# Patient Record
Sex: Male | Born: 1940 | Race: White | Hispanic: No | Marital: Married | Smoking: Never smoker
Health system: Southern US, Community
[De-identification: ages and names within clinical notes are randomized; demographics above are authoritative.]

## PROBLEM LIST (undated history)

## (undated) DIAGNOSIS — F32A Depression, unspecified: Secondary | ICD-10-CM

## (undated) DIAGNOSIS — R7301 Impaired fasting glucose: Secondary | ICD-10-CM

## (undated) DIAGNOSIS — M1990 Unspecified osteoarthritis, unspecified site: Secondary | ICD-10-CM

## (undated) DIAGNOSIS — F329 Major depressive disorder, single episode, unspecified: Secondary | ICD-10-CM

## (undated) DIAGNOSIS — D126 Benign neoplasm of colon, unspecified: Secondary | ICD-10-CM

## (undated) DIAGNOSIS — R351 Nocturia: Secondary | ICD-10-CM

## (undated) DIAGNOSIS — E785 Hyperlipidemia, unspecified: Secondary | ICD-10-CM

## (undated) DIAGNOSIS — I10 Essential (primary) hypertension: Secondary | ICD-10-CM

## (undated) DIAGNOSIS — N529 Male erectile dysfunction, unspecified: Secondary | ICD-10-CM

## (undated) DIAGNOSIS — T8131XA Disruption of external operation (surgical) wound, not elsewhere classified, initial encounter: Secondary | ICD-10-CM

## (undated) DIAGNOSIS — G4733 Obstructive sleep apnea (adult) (pediatric): Secondary | ICD-10-CM

## (undated) DIAGNOSIS — I2510 Atherosclerotic heart disease of native coronary artery without angina pectoris: Secondary | ICD-10-CM

## (undated) HISTORY — DX: Male erectile dysfunction, unspecified: N52.9

## (undated) HISTORY — PX: TONSILLECTOMY: SUR1361

## (undated) HISTORY — DX: Major depressive disorder, single episode, unspecified: F32.9

## (undated) HISTORY — DX: Hyperlipidemia, unspecified: E78.5

## (undated) HISTORY — DX: Obstructive sleep apnea (adult) (pediatric): G47.33

## (undated) HISTORY — DX: Nocturia: R35.1

## (undated) HISTORY — PX: OTHER SURGICAL HISTORY: SHX169

## (undated) HISTORY — DX: Depression, unspecified: F32.A

## (undated) HISTORY — DX: Atherosclerotic heart disease of native coronary artery without angina pectoris: I25.10

## (undated) HISTORY — DX: Impaired fasting glucose: R73.01

## (undated) HISTORY — DX: Unspecified osteoarthritis, unspecified site: M19.90

## (undated) HISTORY — DX: Benign neoplasm of colon, unspecified: D12.6

## (undated) HISTORY — DX: Essential (primary) hypertension: I10

## (undated) HISTORY — DX: Disruption of external operation (surgical) wound, not elsewhere classified, initial encounter: T81.31XA

---

## 2005-09-26 HISTORY — PX: OTHER SURGICAL HISTORY: SHX169

## 2007-01-10 HISTORY — PX: OTHER SURGICAL HISTORY: SHX169

## 2010-02-20 DIAGNOSIS — I2510 Atherosclerotic heart disease of native coronary artery without angina pectoris: Secondary | ICD-10-CM

## 2010-02-21 DIAGNOSIS — I2510 Atherosclerotic heart disease of native coronary artery without angina pectoris: Secondary | ICD-10-CM

## 2010-02-21 HISTORY — PX: CORONARY ARTERY BYPASS GRAFT: SHX141

## 2010-03-03 DIAGNOSIS — L03319 Cellulitis of trunk, unspecified: Secondary | ICD-10-CM

## 2010-03-03 DIAGNOSIS — L02219 Cutaneous abscess of trunk, unspecified: Secondary | ICD-10-CM

## 2010-03-05 DIAGNOSIS — IMO0001 Reserved for inherently not codable concepts without codable children: Secondary | ICD-10-CM

## 2010-03-05 DIAGNOSIS — E1165 Type 2 diabetes mellitus with hyperglycemia: Secondary | ICD-10-CM

## 2010-03-16 ENCOUNTER — Encounter (INDEPENDENT_AMBULATORY_CARE_PROVIDER_SITE_OTHER): Payer: Medicare Other

## 2010-03-16 DIAGNOSIS — L02219 Cutaneous abscess of trunk, unspecified: Secondary | ICD-10-CM

## 2010-03-16 DIAGNOSIS — L03319 Cellulitis of trunk, unspecified: Secondary | ICD-10-CM

## 2010-03-17 NOTE — Assessment & Plan Note (Signed)
[facility]  Jonathan Chapman, Jonathan Chapman DOB:  1940/02/22                                        March 16, 2010 CHART #:  [id number]  Jonathan Chapman returned today for scheduled followup of his sternal wound.  He had sternal wound debridement about 10 days ago and has been managed at home with a wound VAC being changed by the home health nurses on Mondays, Wednesdays, and Fridays.  Overall, he feels well.  He denies any fever or chills.  His appetite is reasonably good.  His weight is stable.  The heart is in regular rate and rhythm.  Breath sounds are clear.  The wound VAC apparatus was removed from suction, then the dressing was removed.  The wound was debrided with some devitalized tissue by Dr. Donata Clay.  A new wound VAC sponge was then fashioned in position and placed back on continuous vacuum with a portable device. The tissues appeared very healthy.  There is a good granulation base on all surfaces.  There is minimal drainage in the canister and small amount of drainage that was observed while sponge was out of the wound. It is clear without any purulent character at all.  PLAN:  New prescription for Keflex 500 mg p.o. t.i.d. was given today along with an updated prescription for the tramadol.  He is to follow up in the office in 1 week and as needed.  Home health nurses will continue therapy as previously prescribed.  Jonathan Chapman, M.D. Electronically Signed  MGR/MEDQ  D:  03/16/2010  T:  03/17/2010  Job:  [id number]

## 2010-03-23 ENCOUNTER — Encounter (INDEPENDENT_AMBULATORY_CARE_PROVIDER_SITE_OTHER): Payer: Medicare Other

## 2010-03-23 DIAGNOSIS — L02219 Cutaneous abscess of trunk, unspecified: Secondary | ICD-10-CM

## 2010-03-24 NOTE — Assessment & Plan Note (Unsigned)
[facility]  Jonathan Chapman, Jonathan Chapman DOB:  1940/11/17                                        March 23, 2010 CHART #:  [id number]  The patient returned today for scheduled followup for management of his sternal wound.  He is status post coronary artery bypass grafting by Dr. 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  After his discharge home, he had several severe coughing episodes one of which resulted in a fall and dehiscence of the lower end of the sternum.  He was taken to the operating room by Dr. Donata Clay on March 03, 2010 for wound debridement and removal of the lower two sternal wires.  He has had a wound VAC in place since that time. Overall, the patient reports he feels well.  He denies any fevers.  His appetite and energy level are improving.  The wound VAC apparatus was removed from the suction device and then the dressing was carefully removed.  The wound was inspected and appears to be granulating very nicely.  The bottom end of the sternum remains separated with obvious motion of the sternum edges when the patient is breathing.  Two-thirds of the sternum appeared to be stable.  The wound continues to granulate very nicely.  There is minimal serous drainage at the bottom end of the wound and is otherwise dry.  There were no areas that required debriding at this visit.  A new wound VAC sponge was fashioned to fit the current dimensions of the wound and was put in place.  The suction apparatus was reapplied and reassured that there were no air leaks.  PLAN:  We will continue with the wound VAC therapy with the changes 3 times weekly as we are currently doing.  I have renewed his prescription for Keflex 500 mg p.o. t.i.d. and his Lasix 20 mg p.o. daily.  He will follow up in our office in 1 week and as needed.  Leary Roca, P.A.  MGR/MEDQ  D:  03/23/2010  T:  03/24/2010  Job:  [id number]

## 2010-03-30 ENCOUNTER — Encounter (INDEPENDENT_AMBULATORY_CARE_PROVIDER_SITE_OTHER): Payer: Medicare Other

## 2010-03-30 DIAGNOSIS — L03319 Cellulitis of trunk, unspecified: Secondary | ICD-10-CM

## 2010-03-30 DIAGNOSIS — I2510 Atherosclerotic heart disease of native coronary artery without angina pectoris: Secondary | ICD-10-CM

## 2010-03-30 DIAGNOSIS — L02219 Cutaneous abscess of trunk, unspecified: Secondary | ICD-10-CM

## 2010-03-31 NOTE — Assessment & Plan Note (Unsigned)
[facility]  [name], Jonathan Chapman DOB:  [birth year]                                        March 30, 2010 CHART #:  [id number]  The patient returns today for followup of his sternal wound.  Please see previous notes for the past medical history of this wound.  Wound VAC is being utilized now and is being changed by the home health nursing agency 3 times weekly.  Overall the patient feels well.  He has no complaints.  He has good appetite.  He denies any fever.  He is having minimal drainage from the sternal wound.  The suction was removed from wound VAC apparatus and it was removed in its entirety.  The wound continues to contract in all dimensions.  There is an excellent flare of granulation tissue on all services.  The lower third of the sternum remains unstable.  The lower third is stable and the incision is well- healed.  The new wound VAC sponge was cut to appropriate size and packed into morning.  Wound VAC draped with apparatus was applied and VAC was reapplied with -125 mmHg.  He is to continue with the current wound care as is being managed by advanced services.  He is to follow up with Jonathan Chapman in 2 weeks and as needed.  I asked him to reduce the Keflex to 500 mg p.o. b.i.d.  Leary Roca, P.A.  MGR/MEDQ  D:  03/30/2010  T:  03/31/2010  Job:  [id number]

## 2010-04-13 ENCOUNTER — Encounter (INDEPENDENT_AMBULATORY_CARE_PROVIDER_SITE_OTHER): Payer: Medicare Other

## 2010-04-13 DIAGNOSIS — I2510 Atherosclerotic heart disease of native coronary artery without angina pectoris: Secondary | ICD-10-CM

## 2010-04-14 NOTE — Assessment & Plan Note (Unsigned)
[facility]  BUNNY, LOWDERMILK DOB:  [birth year]                                        April 13, 2010 CHART #:  [id number]  The patient returns today for followup of his sternal wound.  The wound VAC continues to be utilized and the home health nursing agency has seen the patient three times a week.  He is doing well.  Upon removing the wound VAC, the wound continues to heal with good granulation tissue. There was minimal drainage present.  The remaining lower third of the wound remains unstable.  The wound does appear to be healing quite well and he will continue with the wound VAC therapy with three time a week dressing changes.  He will continue also his Keflex 500 mg p.o. b.i.d. and he was given a prescription for Ultram 50 mg 1-2 p.o. q. 4 h. p.r.n. pain.  Otherwise, the patient will return to see [place] in 2 weeks.  Tera Mater. Arvilla Market, MD  BC/MEDQ  D:  04/13/2010  T:  04/14/2010  Job:  [id number]

## 2010-04-27 ENCOUNTER — Encounter (INDEPENDENT_AMBULATORY_CARE_PROVIDER_SITE_OTHER): Payer: Medicare Other

## 2010-04-27 DIAGNOSIS — I2510 Atherosclerotic heart disease of native coronary artery without angina pectoris: Secondary | ICD-10-CM

## 2010-04-27 DIAGNOSIS — T8140XA Infection following a procedure, unspecified, initial encounter: Secondary | ICD-10-CM

## 2010-04-28 NOTE — Assessment & Plan Note (Unsigned)
[facility]  Jonathan Chapman, Jonathan Chapman DOB:  11-Jun-1940                                        April 27, 2010 CHART #:  [id number]  We saw the patient in the clinic today.  He continues to wear the wound VAC over the sternal wound and wound continues to granulate nicely. There continues to be some movement of the lower third of the sternal wound.  We discussed with the patient discontinuing the wound VAC, but we will continue to use it for one more week with the anticipation of discontinuing the wound VAC next week and beginning b.i.d. dressing changes.  He will continue to take his Keflex 500 mg p.o. b.i.d. and we will see him in the clinic next week.  Tera Mater. Arvilla Market, MD  BC/MEDQ  D:  04/27/2010  T:  04/28/2010  Job:  [id number]

## 2010-05-05 ENCOUNTER — Encounter (INDEPENDENT_AMBULATORY_CARE_PROVIDER_SITE_OTHER): Payer: Medicare Other

## 2010-05-05 DIAGNOSIS — I2510 Atherosclerotic heart disease of native coronary artery without angina pectoris: Secondary | ICD-10-CM

## 2010-05-11 ENCOUNTER — Encounter (INDEPENDENT_AMBULATORY_CARE_PROVIDER_SITE_OTHER): Payer: Medicare Other

## 2010-05-11 DIAGNOSIS — T8140XA Infection following a procedure, unspecified, initial encounter: Secondary | ICD-10-CM

## 2010-05-12 NOTE — Assessment & Plan Note (Signed)
[facility]  Jonathan, Chapman DOB:  [birth year]                                        May 11, 2010 CHART #:  [id number]  The patient returned today for followup of his sternal wound.  The wound VAC was discontinued last week and his wife has been caring for it with saline moist-to-dry dressing changes twice daily.  Overall, the patient states that he feels well.  He has no new complaints.  He denies any new changes in his sternal wound including any additional pain.  His appetite is good and he remains fairly active.  On exam, the wound dressing was removed.  The skin to the left and right of the open wound remains reddened with the left being a little more pronounced than the right.  This area had become macerated by the wound VAC film dressing.  There is no breakdown of the skin.  It is not tender.  The wound itself is not changed in dimensions since previous exam.  There is some fibrinous granulation tissue along both wound edges.  The wound was examined and debrided by Dr. Dorris Fetch.  The silver nitrate was applied to the granulation tissue.  The wound was then redressed with a saline moistened 2x2 gauze and covered with a dry dressing.  The possibility of additional debridement in the operating room and possible delayed primary closure was discussed with the patient and his wife and Dr. Dorris Fetch.  He was concerned that the present condition of the wound edges may lead to suboptimal result.  We will continue with the saline moist-to-dry dressings until the skin changes have resolved. I asked the patient to discontinue the Keflex and gave him new prescription for dicloxacillin 250 mg p.o. q.6 h.  He has to follow up in 2 weeks and as needed.  Salvatore Decent Dorris Fetch, M.D. Electronically Signed  MGR/MEDQ  D:  05/11/2010  T:  05/12/2010  Job:  [id number]

## 2010-05-24 ENCOUNTER — Encounter (INDEPENDENT_AMBULATORY_CARE_PROVIDER_SITE_OTHER): Payer: Medicare Other

## 2010-05-24 DIAGNOSIS — I2510 Atherosclerotic heart disease of native coronary artery without angina pectoris: Secondary | ICD-10-CM

## 2010-05-24 DIAGNOSIS — T8140XA Infection following a procedure, unspecified, initial encounter: Secondary | ICD-10-CM

## 2010-05-25 NOTE — Assessment & Plan Note (Addendum)
[facility]  Jonathan Chapman, Jonathan Chapman DOB:  February 21, 1940                                        May 25, 2010 CHART #:  [id number]  The patient returned for follow up of his sternal wound.  Overall, he feels well.  He describes having few sharp pains in the parasternal area which come and go very quickly.  He is not having any fevers.  His appetite is good, and overall he is making reasonable progress following surgery.  The wound packing is removed.  The wound measures about 4 cm in length by 1.8 cm in depth by approximately 0.5 cm in width.  There is some thin yellowish drainage at the wound as well as on the packing material that was removed.  The skin surrounding the wound has continued to improve since his last visit.  He is currently taking dicloxacillin 500 mg p.o. q.i.d.  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  The wound was cultured and when it cleaned and repacked with a saline-moistened gauze and covered with dry dressing.  The wound was examined and was seen by Dr. Laneta Simmers.  Dr. Laneta Simmers discussed the situation at length with the patient and his wife.  We should continue with the saline-moistened dry dressing changes at this point.  He also discussed the possibility of additional surgical debridement followed by possible reconstructive surgery at some point.  PLAN:  The patient will continue with the saline-moist dry dressing changes twice daily and also continue with dicloxacillin pending the culture results.  Evelene Croon, M.D. Electronically Signed  MGR/MEDQ  D:  05/25/2010  T:  05/25/2010  Job:  [id number]

## 2010-06-02 ENCOUNTER — Encounter (INDEPENDENT_AMBULATORY_CARE_PROVIDER_SITE_OTHER): Payer: Medicare Other

## 2010-06-02 DIAGNOSIS — T8140XA Infection following a procedure, unspecified, initial encounter: Secondary | ICD-10-CM

## 2010-06-02 DIAGNOSIS — I2510 Atherosclerotic heart disease of native coronary artery without angina pectoris: Secondary | ICD-10-CM

## 2010-06-03 NOTE — Assessment & Plan Note (Unsigned)
[facility]  Jonathan Chapman, Jonathan Chapman DOB:  06/27/1940                                        Jun 02, 2010 CHART #:  [id number]  The patient returned today for followup of his open sternal wound.  He denies any new symptoms.  He reports he continues to have some sharp pain along the left costal angle when he takes a deep breath.  This is a very short duration and does not radiate.  He denies any fever or change in appetite.  He is continuing with the saline moist-to-dry dressings as we have requested.  The patient requested referral to Dr. Madelin Headings of the Infectious Disease Service last week.  Dr. Trisha Mangle contacted me few days ago with results of that clinical encounter.  On examination, she does not believe there is active infection within his wound.  She further relate to me that she obtained C-reactive protein and sed rate, which were within normal limits.  She obtained cultures in her office, which grew Corynebacterium, which she consider to be skin contaminant.  Conclusion is that there was no infection in this wound to treat at this time based on what she is able to observe clinically and also based on the studies mentioned above.  The culture that we obtained in our office last week had no growth at all.  After further discussion of the patient's case with Dr. Laneta Simmers last week, he is recommended referral to plastic surgeon with whom he has worked on similar cases in [place].  I discussed this with the patient and his wife and they agreed to proceed with discussion of possible closure by plastic surgeon.  We will contact your office and make the necessary arrangements for an appointment next week.  The wound dressing is changed today and the wound is clean with very small amount of yellowish tanned exudate.  There is good granulation on all surfaces.  The wound probes approximately 1.5 cm in depth and is only about 2 mm wide x 4 cm  in length.  He had instability of the lower part of the sternum and is unchanged.  PLAN:  As discussed above we will arrange for referral to a plastic surgeon and arrange for next week regarding further surgical intervention.  The patient and his wife are in agreement with this plan of management.  I see no reason for continuing the antibiotics and I have asked him to stop the doxycycline at this stage.  Leary Roca, P.A.  MGR/MEDQ  D:  06/02/2010  T:  06/03/2010  Job:  [id number]

## 2010-06-15 ENCOUNTER — Encounter (INDEPENDENT_AMBULATORY_CARE_PROVIDER_SITE_OTHER): Payer: Medicare Other

## 2010-06-15 DIAGNOSIS — I2510 Atherosclerotic heart disease of native coronary artery without angina pectoris: Secondary | ICD-10-CM

## 2010-06-15 DIAGNOSIS — T8140XA Infection following a procedure, unspecified, initial encounter: Secondary | ICD-10-CM

## 2010-06-15 NOTE — Assessment & Plan Note (Signed)
[facility]  Jonathan Chapman, Jonathan Chapman DOB:  1940-09-03                                        June 15, 2010 CHART #:  [id number]  The patient returned today for followup of his sternotomy wound.  He has been doing fairly well at home and has been doing wet-to-dry dressing changes to the wound.  He denies any fever or chills.  He has had some pain on both sides of his chest related to activity and he believes that some of it may be due to how he slept at night.  We did a CT scan of his chest on June 13, 2010, to reevaluate the sternum and see how things were healing deeper down.  This showed that the upper portion of the sternum and manubrium are together with healing and new bone formation.  The lower third of the sternum has multiple fractures on both sides with bone fragments visible.  The sternal wires have been removed at the time of debridement.  There is visible open wound in the midline that extends downward beneath the sternum.  There is associated inflammatory changes in the deeper tissues down to the pericardium, but there were no fluid collections and no sign of an abscess.  On physical examination today, his chest wound is about half a size that it was when I last saw him a couple of weeks ago.  The skin looks much better with no signs of infection.  The wound itself, is clean and granulating.  It appears to be closing nicely.  There is no tenderness.  IMPRESSION:  The patient has a healing sternal wound status post removal of the lowermost 3 sternal wires and treatment with a wound VAC following a fall with fracture of the sternum at multiple levels.  I think his wound is healing in fairly well and there are no signs of infection.  He was seen by Dr. Trisha Mangle, of Infectious Disease and was not felt to have any active infection.  Apparently, his sed rate and CRP were within normal limits.  The upper two-third of his sternum appears to be  healing together well.  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  I explained to him that the lower sternum will never be completely stable, but I think he still would be able to be quite active as long as it is healing.  I discussed the possibility that this may heal and then breakdown again indicating that there is a focus of foreign body or nonviable bone deep in the wound and this would require open debridement and probably muscle flap reconstruction.  I told him I did not see any reason to proceed ahead in that direction unless it was clear that the wound had broken down again after healing.  I did discuss the case with Dr. Etter Sjogren of Plastic and Reconstructive Surgery in [place] and he has graciously agreed to see the patient for consultation in case debridement and reconstruction is necessary down the road.  His wife was given the number for Dr. Odis Luster' office and will call to make an appointment.  All of their questions were answered and I will have him return to the office in 2 weeks so that his primary surgeon, Dr. Sheliah Plane can examine the wound.  I think that when Dr. Tyrone Sage returns to [facility], the patient should be followed up in our [facility] by either Dr. Tyrone Sage or myself until this problem is completely resolved.  Evelene Croon, M.D. Electronically Signed  BB/MEDQ  D:  06/15/2010  T:  06/15/2010  Job:  [id number]  cc:   Etter Sjogren, M.D.

## 2010-06-29 ENCOUNTER — Encounter (INDEPENDENT_AMBULATORY_CARE_PROVIDER_SITE_OTHER): Payer: Medicare Other

## 2010-06-29 DIAGNOSIS — I2510 Atherosclerotic heart disease of native coronary artery without angina pectoris: Secondary | ICD-10-CM

## 2010-06-30 NOTE — Assessment & Plan Note (Signed)
[facility]  Jonathan Chapman, Jonathan Chapman DOB:  [birth year]                                        June 29, 2010 CHART #:  [id number]  The patient returns to the office today for followup visit regarding his sternal wound.  On June 13, 2010, he had undergone CT scan of the chest. He appears to be doing well at home.  He denies any popping and clicking of the sternum though we know the lower portion has some instability. After being discharged home, he fell and struck his sternum and since then had required debridement of the sternal incision.  At this point, he is doing well at home with local wound care.  On exam the patient's sternum to palpation feels stable even the very lower portion does not have a lot of movement.  The area that had been open is now almost completely granulated in with complete skin closure except for a very small part at the lower pole of the incision.  There is no evidence of active infection or cellulitis.  After seeing the patient and examining him I have discussed with him not proceeding with any further operative therapy at this point it is likely that it will granulate in.  With the upper two-thirds of the sternum stable and bone formation, it is likely he will be able to get back to normal activities.  I have still cautioned him about any heavy lifting and will follow up in 4-6 weeks to see how he is doing and how the wound is healing.  At this point, I see no advantage to any muscle flaps or rewiring of his sternum and this has been discussed with him.  Sheliah Plane, MD Electronically Signed  EG/MEDQ  D:  06/29/2010  T:  06/30/2010  Job:  [id number]

## 2010-08-04 ENCOUNTER — Encounter: Payer: Medicare Other | Admitting: Cardiothoracic Surgery

## 2010-08-04 ENCOUNTER — Encounter (INDEPENDENT_AMBULATORY_CARE_PROVIDER_SITE_OTHER): Payer: Medicare Other | Admitting: Cardiothoracic Surgery

## 2010-08-04 DIAGNOSIS — I2510 Atherosclerotic heart disease of native coronary artery without angina pectoris: Secondary | ICD-10-CM

## 2010-08-04 NOTE — Assessment & Plan Note (Signed)
OFFICE VISIT  WAKE, CONLEE DOB:  1940-02-17                                        August 04, 2010 CHART #:  [id number]  The patient underwent coronary artery bypass grafting in [facility] in mid February.  He developed a partial dehiscence of the lower portion of his sternal incision which was re-explored by Dr. Morton Peters.  He has had some problem with lower portion of the wound healing, but now returns for followup visit.  Since last seen a month ago, he has done well.  The incision is now completely healed.  He has no drainage. There is no popping, clicking, or obvious instability of incision.  On exam, his blood pressure 106/68, pulse is 75, respiratory rate is 18, and O2 sats 97%.  His sternum is stable and well healed.  There is no drainage area.  The lower portion of this incision is retracted slightly.  His lungs are clear bilaterally.  Lower extremities are without any edema.  The patient seems to be doing well now with his wound stabilized without any evidence of dehiscence or looseness of the sternum, will allow him to slowly increase his activities, have line into the tennis ball slightly, but no competitive plan yet.  He is enrolled in the cardiac rehab program in [place].  I will plan to see him back in 3 months with a followup chest x-ray.  Sheliah Plane, MD Electronically Signed  EG/MEDQ  D:  08/04/2010  T:  08/04/2010  Job:  [id number]  cc:   [facility]

## 2010-08-05 NOTE — Assessment & Plan Note (Signed)
[facility]  Jonathan Chapman, Jonathan Chapman DOB:  05-23-1940                                        May 25, 2010 CHART #:  [id number]  The patient called the office earlier today and asked for referral to the Infectious Disease Service, specifically Dr. Trisha Mangle, with whom the patient has worked in the past.  I contacted Dr. Uvaldo Bristle office to confirm that we approve of her proceeding with consultation regarding his case.  The patient has a followup appointment with [place] in 1 week.  Evelene Croon, M.D. Electronically Signed  MGR/MEDQ  D:  05/25/2010  T:  05/25/2010  Job:  [id number]

## 2010-10-20 ENCOUNTER — Ambulatory Visit: Payer: Medicare Other | Admitting: Cardiothoracic Surgery

## 2010-10-25 ENCOUNTER — Encounter: Payer: Self-pay | Admitting: Cardiothoracic Surgery

## 2010-10-25 DIAGNOSIS — R7301 Impaired fasting glucose: Secondary | ICD-10-CM | POA: Insufficient documentation

## 2010-10-25 DIAGNOSIS — I10 Essential (primary) hypertension: Secondary | ICD-10-CM

## 2010-10-25 DIAGNOSIS — R351 Nocturia: Secondary | ICD-10-CM | POA: Insufficient documentation

## 2010-10-25 DIAGNOSIS — D126 Benign neoplasm of colon, unspecified: Secondary | ICD-10-CM | POA: Insufficient documentation

## 2010-10-25 DIAGNOSIS — N529 Male erectile dysfunction, unspecified: Secondary | ICD-10-CM

## 2010-10-25 DIAGNOSIS — G4733 Obstructive sleep apnea (adult) (pediatric): Secondary | ICD-10-CM | POA: Insufficient documentation

## 2010-10-25 DIAGNOSIS — F329 Major depressive disorder, single episode, unspecified: Secondary | ICD-10-CM

## 2010-10-25 DIAGNOSIS — E785 Hyperlipidemia, unspecified: Secondary | ICD-10-CM | POA: Insufficient documentation

## 2010-10-25 DIAGNOSIS — I2510 Atherosclerotic heart disease of native coronary artery without angina pectoris: Secondary | ICD-10-CM | POA: Insufficient documentation

## 2010-10-25 DIAGNOSIS — F32A Depression, unspecified: Secondary | ICD-10-CM | POA: Insufficient documentation

## 2010-10-26 ENCOUNTER — Other Ambulatory Visit: Payer: Self-pay | Admitting: Cardiothoracic Surgery

## 2010-10-26 DIAGNOSIS — I2510 Atherosclerotic heart disease of native coronary artery without angina pectoris: Secondary | ICD-10-CM

## 2010-10-26 DIAGNOSIS — T8131XA Disruption of external operation (surgical) wound, not elsewhere classified, initial encounter: Secondary | ICD-10-CM | POA: Insufficient documentation

## 2010-10-27 ENCOUNTER — Ambulatory Visit
Admission: RE | Admit: 2010-10-27 | Discharge: 2010-10-27 | Disposition: A | Discharge status: Home / Self Care | Payer: Medicare Other | Source: Ambulatory Visit | Attending: Cardiothoracic Surgery | Admitting: Cardiothoracic Surgery

## 2010-10-27 ENCOUNTER — Ambulatory Visit (INDEPENDENT_AMBULATORY_CARE_PROVIDER_SITE_OTHER): Payer: Medicare Other | Admitting: Cardiothoracic Surgery

## 2010-10-27 ENCOUNTER — Encounter: Payer: Self-pay | Admitting: Cardiothoracic Surgery

## 2010-10-27 VITALS — BP 128/76 | HR 60 | Resp 16 | Ht 70.5 in | Wt 225.0 lb

## 2010-10-27 DIAGNOSIS — I2510 Atherosclerotic heart disease of native coronary artery without angina pectoris: Secondary | ICD-10-CM

## 2010-10-27 NOTE — Progress Notes (Signed)
[address]            [place] [address]          [phone]       Jonathan Chapman Date of Birth: [birth year]  Linford Arnold, MD  Chief Complaint:  Chief Complaint  Patient presents with  . 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 He developed a lower sternal dehiscence following his surgery requiring reexploration and removal of the lower 2 sternal wires. The remainder of the sternum continued to heal and the patient did not require any other for surgical intervention. He returns now noting that he is back playing tennis without difficulty. He notes some slight shortness of breath with heavy exertion, which is improved as he's increased his physical activity.  Past Medical History  Diagnosis Date  . Adenoma of large intestine     benign  . Benign essential HTN   . CAD (coronary artery disease)   . Depression   . Hyperlipemia   . Impaired fasting glucose   . Obstructive sleep apnea     moderate  . Organic impotence   . 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 Nocturia   . Dehiscence of surgical wound     Past Surgical History  Procedure Date  . Left knee replacement 09/26/2005  . Tonsillectomy   . Right total knee arthroplasty 2009  . Uvuloplasty   . Coronary artery bypass graft 02/21/2010    [facility]     History  Smoking status  . Never Smoker   Smokeless tobacco  . Not on file    History  Alcohol Use No    History   Social History  . Marital Status: Married    Spouse Name: N/A    Number of Children: N/A  . Years of Education: N/A   Occupational History  . Not on file.   Social History Main Topics  . Smoking status: Never Smoker   . Smokeless  tobacco: Not on file  . Alcohol Use: No  . Drug Use: No  . Sexually Active: Not on file   Other Topics Concern  . Not on file   Social History Narrative  . No narrative on file    No Known Allergies  Current Outpatient Prescriptions  Medication Sig Dispense Refill  . Ascorbic Acid (VITAMIN C) 100 MG tablet Take 100 mg by mouth daily.        Marland Kitchen aspirin 81 MG tablet Take 81 mg by mouth daily.        Marland Kitchen atorvastatin (LIPITOR) 40 MG tablet Take 20 mg by mouth daily.       . carvedilol (COREG) 6.25 MG tablet Take 6.25 mg by mouth 2 (two) times daily with a meal.        . docusate sodium (COLACE) 100 MG capsule Take 100 mg by mouth 2 (two) times daily.        . ferrous sulfate (SLOW IRON) 160 (50 FE) MG TBCR Take by mouth daily.        . fexofenadine (ALLEGRA) 180 MG tablet Take 180 mg by mouth daily  as needed.       Marland Kitchen HYDROcodone-acetaminophen (VICODIN) 5-500 MG per tablet Take 1 tablet by mouth every 6 (six) hours as needed.        Marland Kitchen lisinopril (PRINIVIL,ZESTRIL) 5 MG tablet Take 5 mg by mouth daily.        . multivitamin (THERAGRAN) per tablet Take 1 tablet by mouth daily.        . sertraline (ZOLOFT) 50 MG tablet Take 25 mg by mouth daily.       . traMADol (ULTRAM) 50 MG tablet Take 50 mg by mouth every 6 (six) hours as needed.        . Vitamin D, Ergocalciferol, (DRISDOL) 50000 UNITS CAPS Take 50,000 Units by mouth.        . beta carotene w/minerals (OCUVITE) tablet Take 1 tablet by mouth daily.        . calcium citrate (CALCITRATE - DOSED IN MG ELEMENTAL CALCIUM) 950 MG tablet Take 1 tablet by mouth daily.           No family history on file.      Physical Exam: BP 128/76  Pulse 60  Resp 16  Ht 5' 10.5" (1.791 m)  Wt 225 lb (102.059 kg)  BMI 31.83 kg/m2  SpO2 96%  On physical exam the patient appears well He has no carotid bruits His lungs are clear bilaterally His sternum is stable without any movement of the bone There is some slight retraction of the incision at  the lower end There is no evidence of sternal wound infection Abdominal exam is benign Lower extremities are without calf tenderness or pedal edema   Diagnostic Studies & Laboratory data: Clinical Data: Status post cardiac surgery in February  CHEST - 2 VIEW  Comparison: None.  Findings: The heart is borderline enlarged. Lungs are under  aerated and clear. Bronchitic changes. No pneumothorax and no  pleural effusion.  IMPRESSION:  Borderline cardiomegaly without decompensation.  Original Report Authenticated By: Donavan Burnet, M.D.     Assessment / Plan: Both from physical exam and chest x-ray appears the patient has now completely healed his sternum and returned to normal physical activity including playing tennis without difficulty He's had no recurrent episodes of angina or congestive heart failure. His original presentation was that of cardiac arrest on the tennis court. I've not made him a return appointment to see me but would be glad to see him at his or DrDaniel's request at any time.   Delight Ovens MD

## 2010-11-03 ENCOUNTER — Ambulatory Visit: Payer: Medicare Other | Admitting: Cardiothoracic Surgery

## 2012-08-28 IMAGING — CR DG CHEST 2V
2 series · 2 of 2 positions shown · non-contrast
Comparison: None.

CLINICAL DATA: Status post cardiac surgery in [REDACTED]

CHEST - 2 VIEW

[w chest pa]
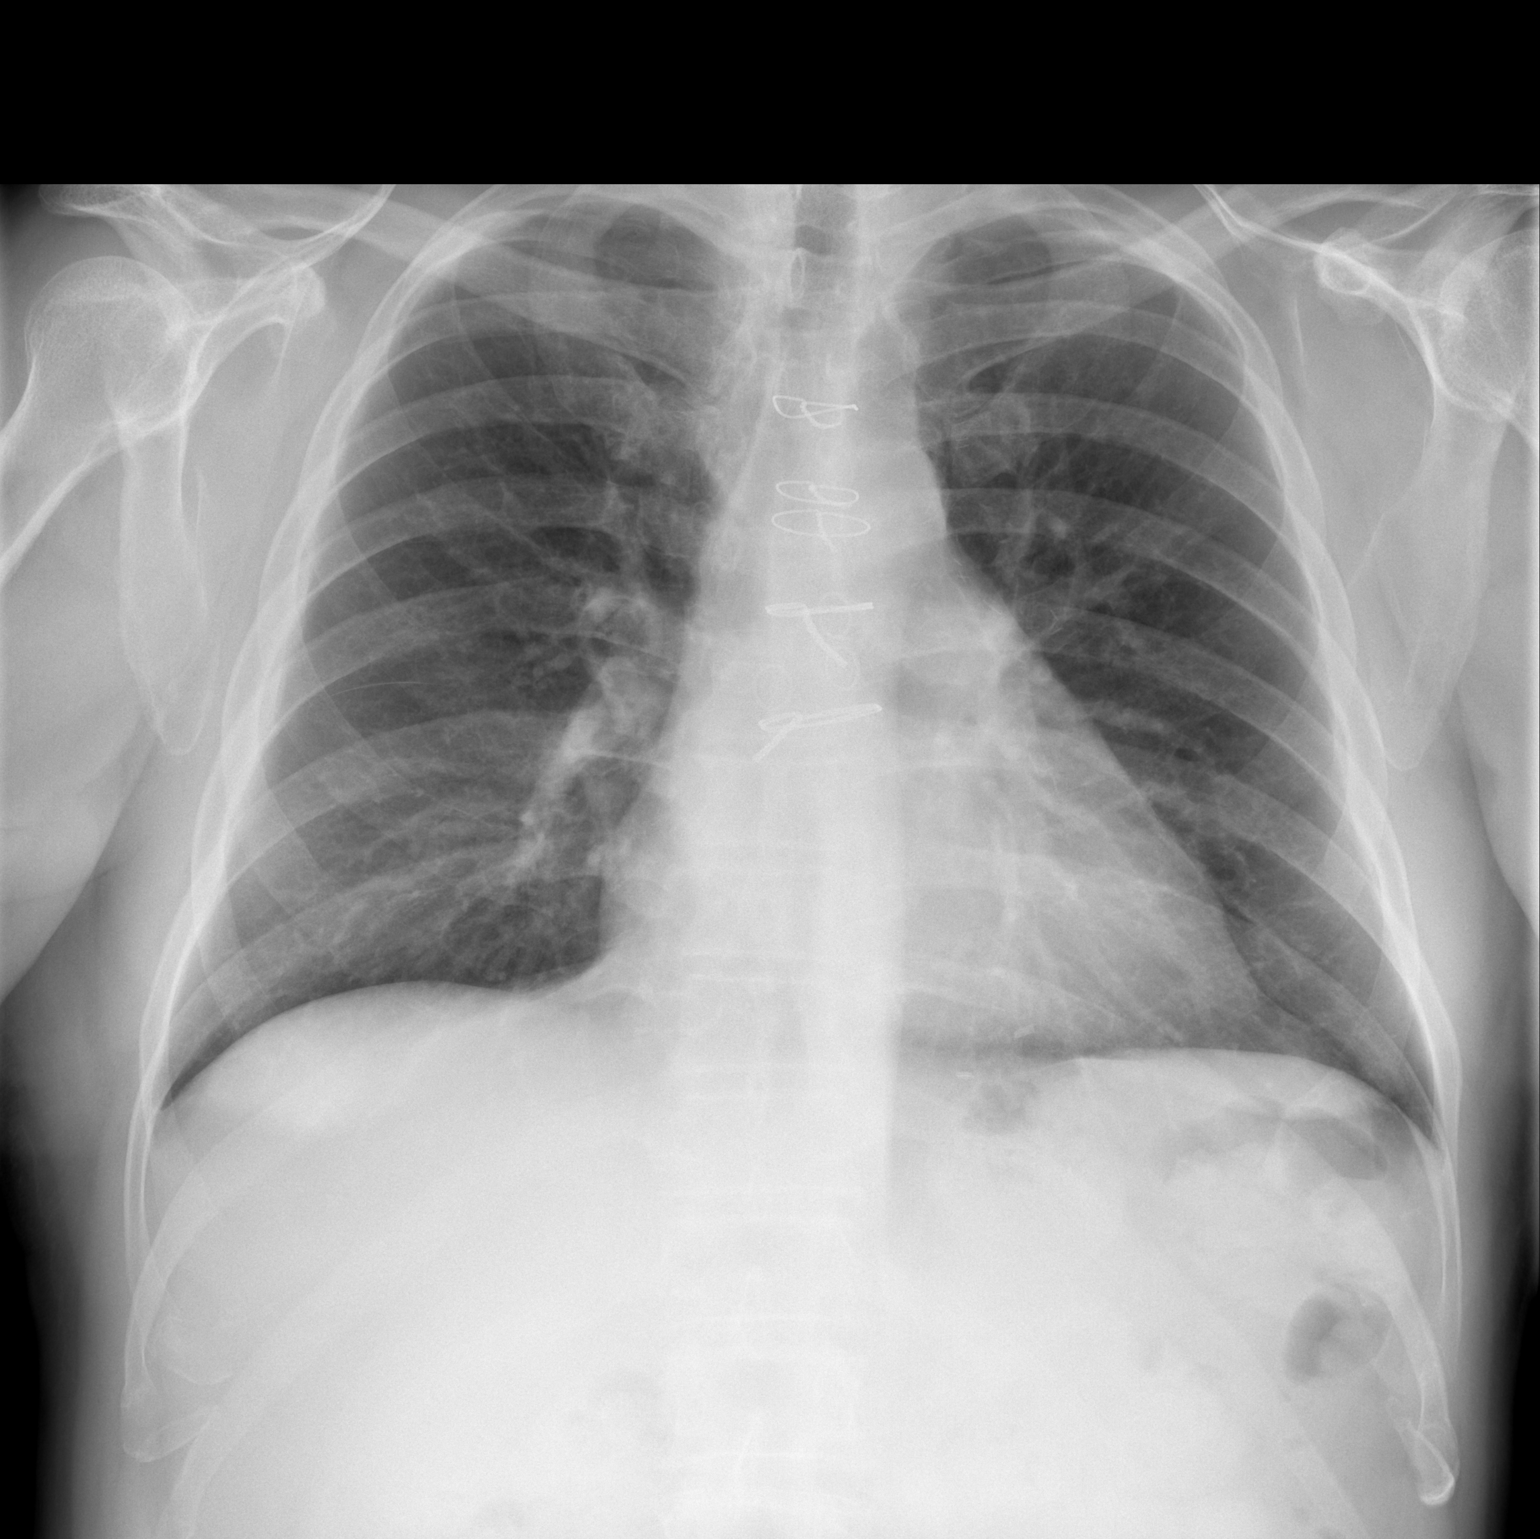

[w chest lat]
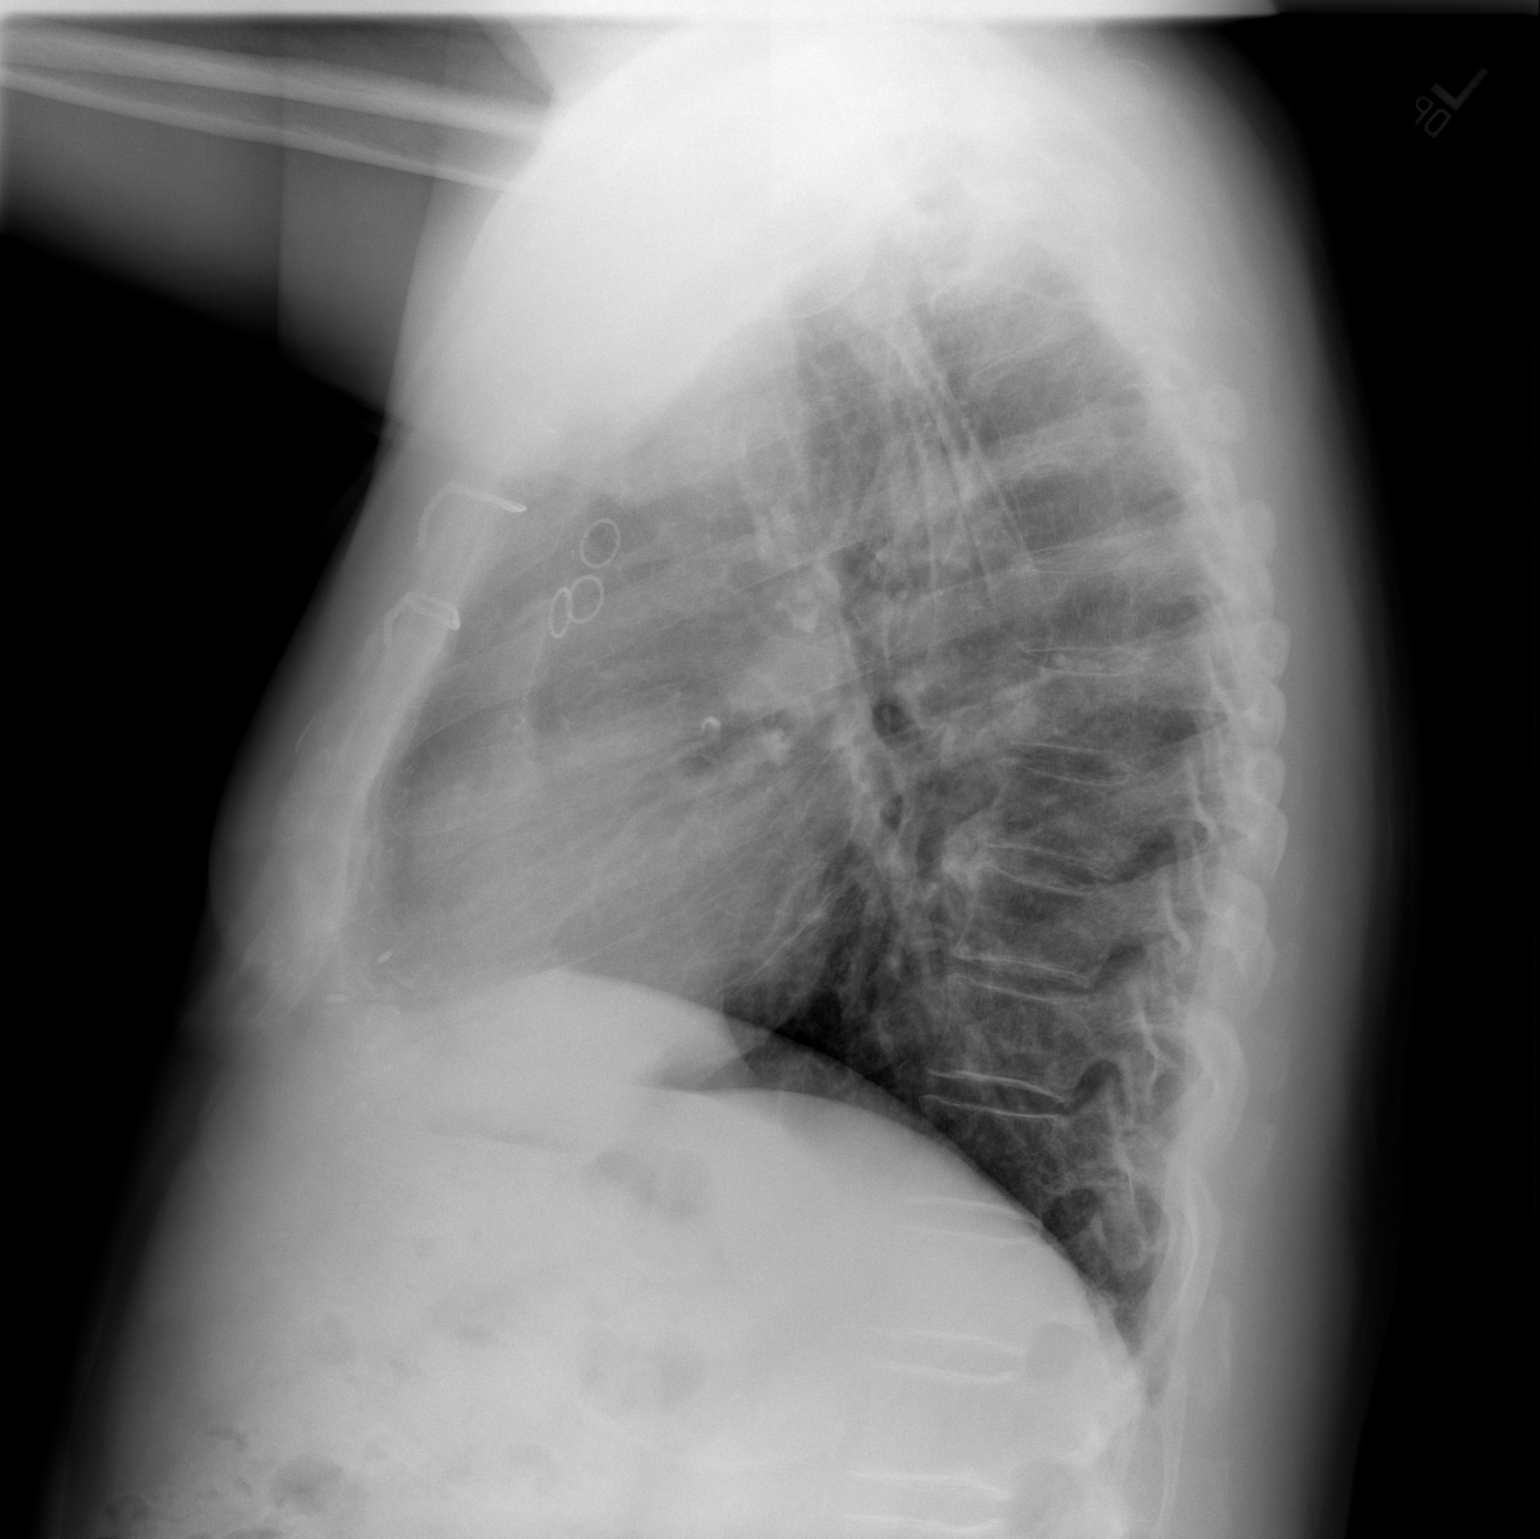

[2 of 2 positions shown; findings below may reference images not displayed]

FINDINGS: The heart is borderline enlarged.  Lungs are under
aerated and clear.  Bronchitic changes.  No pneumothorax and no
pleural effusion.
IMPRESSION: Borderline cardiomegaly without decompensation.

## 2014-02-05 DIAGNOSIS — R413 Other amnesia: Secondary | ICD-10-CM | POA: Insufficient documentation

## 2014-02-05 DIAGNOSIS — E1142 Type 2 diabetes mellitus with diabetic polyneuropathy: Secondary | ICD-10-CM | POA: Insufficient documentation

## 2014-07-27 DIAGNOSIS — Z951 Presence of aortocoronary bypass graft: Secondary | ICD-10-CM | POA: Insufficient documentation

## 2015-02-01 DIAGNOSIS — D485 Neoplasm of uncertain behavior of skin: Secondary | ICD-10-CM | POA: Diagnosis not present

## 2015-02-01 DIAGNOSIS — D1801 Hemangioma of skin and subcutaneous tissue: Secondary | ICD-10-CM | POA: Diagnosis not present

## 2015-02-01 DIAGNOSIS — L820 Inflamed seborrheic keratosis: Secondary | ICD-10-CM | POA: Diagnosis not present

## 2015-02-08 DIAGNOSIS — G4733 Obstructive sleep apnea (adult) (pediatric): Secondary | ICD-10-CM | POA: Diagnosis not present

## 2015-02-10 DIAGNOSIS — C44311 Basal cell carcinoma of skin of nose: Secondary | ICD-10-CM | POA: Diagnosis not present

## 2015-02-26 DIAGNOSIS — J209 Acute bronchitis, unspecified: Secondary | ICD-10-CM | POA: Diagnosis not present

## 2015-02-26 DIAGNOSIS — R05 Cough: Secondary | ICD-10-CM | POA: Diagnosis not present

## 2015-04-08 DIAGNOSIS — R05 Cough: Secondary | ICD-10-CM | POA: Diagnosis not present

## 2015-04-08 DIAGNOSIS — I10 Essential (primary) hypertension: Secondary | ICD-10-CM | POA: Diagnosis not present

## 2015-04-08 DIAGNOSIS — E1142 Type 2 diabetes mellitus with diabetic polyneuropathy: Secondary | ICD-10-CM | POA: Diagnosis not present

## 2015-04-08 DIAGNOSIS — I2510 Atherosclerotic heart disease of native coronary artery without angina pectoris: Secondary | ICD-10-CM | POA: Diagnosis not present

## 2015-04-08 DIAGNOSIS — R413 Other amnesia: Secondary | ICD-10-CM | POA: Diagnosis not present

## 2015-04-08 DIAGNOSIS — E785 Hyperlipidemia, unspecified: Secondary | ICD-10-CM | POA: Diagnosis not present

## 2015-04-14 DIAGNOSIS — E1142 Type 2 diabetes mellitus with diabetic polyneuropathy: Secondary | ICD-10-CM | POA: Diagnosis not present

## 2015-04-14 DIAGNOSIS — R413 Other amnesia: Secondary | ICD-10-CM | POA: Diagnosis not present

## 2015-04-14 DIAGNOSIS — G4733 Obstructive sleep apnea (adult) (pediatric): Secondary | ICD-10-CM | POA: Diagnosis not present

## 2015-05-28 DIAGNOSIS — J069 Acute upper respiratory infection, unspecified: Secondary | ICD-10-CM | POA: Diagnosis not present

## 2015-07-02 DIAGNOSIS — G4733 Obstructive sleep apnea (adult) (pediatric): Secondary | ICD-10-CM | POA: Diagnosis not present

## 2015-07-16 DIAGNOSIS — H40033 Anatomical narrow angle, bilateral: Secondary | ICD-10-CM | POA: Insufficient documentation

## 2015-07-16 DIAGNOSIS — E119 Type 2 diabetes mellitus without complications: Secondary | ICD-10-CM | POA: Insufficient documentation

## 2015-08-04 DIAGNOSIS — E1142 Type 2 diabetes mellitus with diabetic polyneuropathy: Secondary | ICD-10-CM | POA: Diagnosis not present

## 2015-08-04 DIAGNOSIS — Z951 Presence of aortocoronary bypass graft: Secondary | ICD-10-CM | POA: Diagnosis not present

## 2015-08-04 DIAGNOSIS — I2510 Atherosclerotic heart disease of native coronary artery without angina pectoris: Secondary | ICD-10-CM | POA: Diagnosis not present

## 2015-08-04 DIAGNOSIS — I10 Essential (primary) hypertension: Secondary | ICD-10-CM | POA: Diagnosis not present

## 2015-08-04 DIAGNOSIS — E785 Hyperlipidemia, unspecified: Secondary | ICD-10-CM | POA: Diagnosis not present

## 2015-08-16 DIAGNOSIS — H52221 Regular astigmatism, right eye: Secondary | ICD-10-CM | POA: Diagnosis not present

## 2015-08-16 DIAGNOSIS — E119 Type 2 diabetes mellitus without complications: Secondary | ICD-10-CM | POA: Diagnosis not present

## 2015-08-16 DIAGNOSIS — H5203 Hypermetropia, bilateral: Secondary | ICD-10-CM | POA: Diagnosis not present

## 2015-08-16 DIAGNOSIS — H524 Presbyopia: Secondary | ICD-10-CM | POA: Diagnosis not present

## 2015-09-07 DIAGNOSIS — G2581 Restless legs syndrome: Secondary | ICD-10-CM | POA: Diagnosis not present

## 2015-09-07 DIAGNOSIS — Z9989 Dependence on other enabling machines and devices: Secondary | ICD-10-CM | POA: Diagnosis not present

## 2015-09-07 DIAGNOSIS — G4733 Obstructive sleep apnea (adult) (pediatric): Secondary | ICD-10-CM | POA: Diagnosis not present

## 2015-09-07 DIAGNOSIS — K219 Gastro-esophageal reflux disease without esophagitis: Secondary | ICD-10-CM | POA: Diagnosis not present

## 2015-10-04 DIAGNOSIS — Z08 Encounter for follow-up examination after completed treatment for malignant neoplasm: Secondary | ICD-10-CM | POA: Diagnosis not present

## 2015-10-04 DIAGNOSIS — Z85828 Personal history of other malignant neoplasm of skin: Secondary | ICD-10-CM | POA: Diagnosis not present

## 2015-10-13 DIAGNOSIS — R413 Other amnesia: Secondary | ICD-10-CM | POA: Diagnosis not present

## 2015-10-13 DIAGNOSIS — G4733 Obstructive sleep apnea (adult) (pediatric): Secondary | ICD-10-CM | POA: Diagnosis not present

## 2015-10-13 DIAGNOSIS — E1142 Type 2 diabetes mellitus with diabetic polyneuropathy: Secondary | ICD-10-CM | POA: Diagnosis not present

## 2015-10-18 DIAGNOSIS — Z1159 Encounter for screening for other viral diseases: Secondary | ICD-10-CM | POA: Diagnosis not present

## 2015-10-18 DIAGNOSIS — I2510 Atherosclerotic heart disease of native coronary artery without angina pectoris: Secondary | ICD-10-CM | POA: Diagnosis not present

## 2015-10-18 DIAGNOSIS — E785 Hyperlipidemia, unspecified: Secondary | ICD-10-CM | POA: Diagnosis not present

## 2015-10-18 DIAGNOSIS — Z23 Encounter for immunization: Secondary | ICD-10-CM | POA: Diagnosis not present

## 2015-10-18 DIAGNOSIS — I10 Essential (primary) hypertension: Secondary | ICD-10-CM | POA: Diagnosis not present

## 2015-10-18 DIAGNOSIS — L989 Disorder of the skin and subcutaneous tissue, unspecified: Secondary | ICD-10-CM | POA: Diagnosis not present

## 2015-10-18 DIAGNOSIS — Z125 Encounter for screening for malignant neoplasm of prostate: Secondary | ICD-10-CM | POA: Diagnosis not present

## 2015-10-18 DIAGNOSIS — E119 Type 2 diabetes mellitus without complications: Secondary | ICD-10-CM | POA: Diagnosis not present

## 2015-10-18 DIAGNOSIS — H9319 Tinnitus, unspecified ear: Secondary | ICD-10-CM | POA: Diagnosis not present

## 2015-11-03 DIAGNOSIS — Z951 Presence of aortocoronary bypass graft: Secondary | ICD-10-CM | POA: Diagnosis not present

## 2015-11-03 DIAGNOSIS — J209 Acute bronchitis, unspecified: Secondary | ICD-10-CM | POA: Diagnosis not present

## 2015-11-03 DIAGNOSIS — R918 Other nonspecific abnormal finding of lung field: Secondary | ICD-10-CM | POA: Diagnosis not present

## 2015-11-03 DIAGNOSIS — I10 Essential (primary) hypertension: Secondary | ICD-10-CM | POA: Diagnosis not present

## 2015-11-03 DIAGNOSIS — R05 Cough: Secondary | ICD-10-CM | POA: Diagnosis not present

## 2015-11-11 DIAGNOSIS — K219 Gastro-esophageal reflux disease without esophagitis: Secondary | ICD-10-CM | POA: Diagnosis not present

## 2015-11-11 DIAGNOSIS — R05 Cough: Secondary | ICD-10-CM | POA: Diagnosis not present

## 2015-11-11 DIAGNOSIS — Z9989 Dependence on other enabling machines and devices: Secondary | ICD-10-CM | POA: Diagnosis not present

## 2015-11-11 DIAGNOSIS — G4733 Obstructive sleep apnea (adult) (pediatric): Secondary | ICD-10-CM | POA: Diagnosis not present

## 2015-11-11 DIAGNOSIS — G2581 Restless legs syndrome: Secondary | ICD-10-CM | POA: Diagnosis not present

## 2015-11-15 DIAGNOSIS — J181 Lobar pneumonia, unspecified organism: Secondary | ICD-10-CM | POA: Diagnosis not present

## 2015-11-15 DIAGNOSIS — R05 Cough: Secondary | ICD-10-CM | POA: Diagnosis not present

## 2015-11-15 DIAGNOSIS — E785 Hyperlipidemia, unspecified: Secondary | ICD-10-CM | POA: Diagnosis not present

## 2015-11-15 DIAGNOSIS — I10 Essential (primary) hypertension: Secondary | ICD-10-CM | POA: Diagnosis not present

## 2015-12-15 DIAGNOSIS — G4733 Obstructive sleep apnea (adult) (pediatric): Secondary | ICD-10-CM | POA: Diagnosis not present

## 2015-12-15 DIAGNOSIS — E1142 Type 2 diabetes mellitus with diabetic polyneuropathy: Secondary | ICD-10-CM | POA: Diagnosis not present

## 2015-12-15 DIAGNOSIS — R413 Other amnesia: Secondary | ICD-10-CM | POA: Diagnosis not present

## 2016-01-21 DIAGNOSIS — L820 Inflamed seborrheic keratosis: Secondary | ICD-10-CM | POA: Diagnosis not present

## 2016-01-25 DIAGNOSIS — Z0000 Encounter for general adult medical examination without abnormal findings: Secondary | ICD-10-CM | POA: Diagnosis not present

## 2016-02-04 DIAGNOSIS — J0100 Acute maxillary sinusitis, unspecified: Secondary | ICD-10-CM | POA: Diagnosis not present

## 2016-02-04 DIAGNOSIS — R05 Cough: Secondary | ICD-10-CM | POA: Diagnosis not present

## 2016-02-04 DIAGNOSIS — G2581 Restless legs syndrome: Secondary | ICD-10-CM | POA: Diagnosis not present

## 2016-02-17 DIAGNOSIS — G4733 Obstructive sleep apnea (adult) (pediatric): Secondary | ICD-10-CM | POA: Diagnosis not present

## 2016-02-17 DIAGNOSIS — G2581 Restless legs syndrome: Secondary | ICD-10-CM | POA: Diagnosis not present

## 2016-02-17 DIAGNOSIS — J0111 Acute recurrent frontal sinusitis: Secondary | ICD-10-CM | POA: Diagnosis not present

## 2016-02-17 DIAGNOSIS — R0602 Shortness of breath: Secondary | ICD-10-CM | POA: Diagnosis not present

## 2016-02-17 DIAGNOSIS — R05 Cough: Secondary | ICD-10-CM | POA: Diagnosis not present

## 2016-02-17 DIAGNOSIS — K219 Gastro-esophageal reflux disease without esophagitis: Secondary | ICD-10-CM | POA: Diagnosis not present

## 2016-02-17 DIAGNOSIS — Z9989 Dependence on other enabling machines and devices: Secondary | ICD-10-CM | POA: Diagnosis not present

## 2016-03-15 DIAGNOSIS — R413 Other amnesia: Secondary | ICD-10-CM | POA: Diagnosis not present

## 2016-03-15 DIAGNOSIS — E1142 Type 2 diabetes mellitus with diabetic polyneuropathy: Secondary | ICD-10-CM | POA: Diagnosis not present

## 2016-03-15 DIAGNOSIS — G4733 Obstructive sleep apnea (adult) (pediatric): Secondary | ICD-10-CM | POA: Diagnosis not present

## 2016-04-05 DIAGNOSIS — G4733 Obstructive sleep apnea (adult) (pediatric): Secondary | ICD-10-CM | POA: Diagnosis not present

## 2016-04-28 DIAGNOSIS — F329 Major depressive disorder, single episode, unspecified: Secondary | ICD-10-CM | POA: Diagnosis not present

## 2016-04-28 DIAGNOSIS — E559 Vitamin D deficiency, unspecified: Secondary | ICD-10-CM | POA: Diagnosis not present

## 2016-04-28 DIAGNOSIS — I2510 Atherosclerotic heart disease of native coronary artery without angina pectoris: Secondary | ICD-10-CM | POA: Diagnosis not present

## 2016-04-28 DIAGNOSIS — I10 Essential (primary) hypertension: Secondary | ICD-10-CM | POA: Diagnosis not present

## 2016-04-28 DIAGNOSIS — E785 Hyperlipidemia, unspecified: Secondary | ICD-10-CM | POA: Diagnosis not present

## 2016-04-28 DIAGNOSIS — E119 Type 2 diabetes mellitus without complications: Secondary | ICD-10-CM | POA: Diagnosis not present

## 2016-04-28 DIAGNOSIS — E1142 Type 2 diabetes mellitus with diabetic polyneuropathy: Secondary | ICD-10-CM | POA: Diagnosis not present

## 2016-05-05 DIAGNOSIS — M1611 Unilateral primary osteoarthritis, right hip: Secondary | ICD-10-CM | POA: Diagnosis not present

## 2016-05-05 DIAGNOSIS — M7061 Trochanteric bursitis, right hip: Secondary | ICD-10-CM | POA: Diagnosis not present

## 2016-05-06 DIAGNOSIS — G4733 Obstructive sleep apnea (adult) (pediatric): Secondary | ICD-10-CM | POA: Diagnosis not present

## 2016-06-05 DIAGNOSIS — G4733 Obstructive sleep apnea (adult) (pediatric): Secondary | ICD-10-CM | POA: Diagnosis not present

## 2016-06-22 DIAGNOSIS — R0609 Other forms of dyspnea: Secondary | ICD-10-CM | POA: Insufficient documentation

## 2016-06-22 DIAGNOSIS — D649 Anemia, unspecified: Secondary | ICD-10-CM | POA: Diagnosis not present

## 2016-06-22 DIAGNOSIS — Z1211 Encounter for screening for malignant neoplasm of colon: Secondary | ICD-10-CM | POA: Diagnosis not present

## 2016-06-22 DIAGNOSIS — R140 Abdominal distension (gaseous): Secondary | ICD-10-CM | POA: Insufficient documentation

## 2016-06-23 DIAGNOSIS — Z1211 Encounter for screening for malignant neoplasm of colon: Secondary | ICD-10-CM | POA: Insufficient documentation

## 2016-06-23 DIAGNOSIS — R0609 Other forms of dyspnea: Secondary | ICD-10-CM | POA: Diagnosis not present

## 2016-06-23 DIAGNOSIS — R0602 Shortness of breath: Secondary | ICD-10-CM | POA: Diagnosis not present

## 2016-06-23 DIAGNOSIS — R140 Abdominal distension (gaseous): Secondary | ICD-10-CM | POA: Diagnosis not present

## 2016-06-23 DIAGNOSIS — Z951 Presence of aortocoronary bypass graft: Secondary | ICD-10-CM | POA: Diagnosis not present

## 2016-06-27 DIAGNOSIS — D649 Anemia, unspecified: Secondary | ICD-10-CM | POA: Diagnosis not present

## 2016-06-27 DIAGNOSIS — N390 Urinary tract infection, site not specified: Secondary | ICD-10-CM | POA: Diagnosis not present

## 2016-06-27 DIAGNOSIS — Z1211 Encounter for screening for malignant neoplasm of colon: Secondary | ICD-10-CM | POA: Diagnosis not present

## 2016-06-27 DIAGNOSIS — R140 Abdominal distension (gaseous): Secondary | ICD-10-CM | POA: Diagnosis not present

## 2016-07-06 DIAGNOSIS — G4733 Obstructive sleep apnea (adult) (pediatric): Secondary | ICD-10-CM | POA: Diagnosis not present

## 2016-08-05 DIAGNOSIS — G4733 Obstructive sleep apnea (adult) (pediatric): Secondary | ICD-10-CM | POA: Diagnosis not present

## 2016-08-10 DIAGNOSIS — R195 Other fecal abnormalities: Secondary | ICD-10-CM | POA: Diagnosis not present

## 2016-08-10 DIAGNOSIS — R140 Abdominal distension (gaseous): Secondary | ICD-10-CM | POA: Diagnosis not present

## 2016-08-10 DIAGNOSIS — Z86010 Personal history of colonic polyps: Secondary | ICD-10-CM | POA: Diagnosis not present

## 2016-08-21 DIAGNOSIS — K8020 Calculus of gallbladder without cholecystitis without obstruction: Secondary | ICD-10-CM | POA: Diagnosis not present

## 2016-08-23 DIAGNOSIS — T25222A Burn of second degree of left foot, initial encounter: Secondary | ICD-10-CM | POA: Diagnosis not present

## 2016-08-23 DIAGNOSIS — E1142 Type 2 diabetes mellitus with diabetic polyneuropathy: Secondary | ICD-10-CM | POA: Diagnosis not present

## 2016-08-29 DIAGNOSIS — I2510 Atherosclerotic heart disease of native coronary artery without angina pectoris: Secondary | ICD-10-CM | POA: Diagnosis not present

## 2016-08-29 DIAGNOSIS — E785 Hyperlipidemia, unspecified: Secondary | ICD-10-CM | POA: Diagnosis not present

## 2016-08-29 DIAGNOSIS — I10 Essential (primary) hypertension: Secondary | ICD-10-CM | POA: Diagnosis not present

## 2016-08-29 DIAGNOSIS — E119 Type 2 diabetes mellitus without complications: Secondary | ICD-10-CM | POA: Diagnosis not present

## 2016-08-31 DIAGNOSIS — K7689 Other specified diseases of liver: Secondary | ICD-10-CM | POA: Diagnosis not present

## 2016-09-05 DIAGNOSIS — G4733 Obstructive sleep apnea (adult) (pediatric): Secondary | ICD-10-CM | POA: Diagnosis not present

## 2016-09-06 DIAGNOSIS — T25222D Burn of second degree of left foot, subsequent encounter: Secondary | ICD-10-CM | POA: Diagnosis not present

## 2016-09-06 DIAGNOSIS — L989 Disorder of the skin and subcutaneous tissue, unspecified: Secondary | ICD-10-CM | POA: Diagnosis not present

## 2016-09-06 DIAGNOSIS — E1142 Type 2 diabetes mellitus with diabetic polyneuropathy: Secondary | ICD-10-CM | POA: Diagnosis not present

## 2016-10-06 DIAGNOSIS — G4733 Obstructive sleep apnea (adult) (pediatric): Secondary | ICD-10-CM | POA: Diagnosis not present

## 2016-10-09 DIAGNOSIS — N400 Enlarged prostate without lower urinary tract symptoms: Secondary | ICD-10-CM | POA: Insufficient documentation

## 2016-10-09 DIAGNOSIS — K219 Gastro-esophageal reflux disease without esophagitis: Secondary | ICD-10-CM | POA: Insufficient documentation

## 2016-10-09 DIAGNOSIS — M159 Polyosteoarthritis, unspecified: Secondary | ICD-10-CM | POA: Insufficient documentation

## 2016-10-09 DIAGNOSIS — Z23 Encounter for immunization: Secondary | ICD-10-CM | POA: Diagnosis not present

## 2016-10-09 DIAGNOSIS — K439 Ventral hernia without obstruction or gangrene: Secondary | ICD-10-CM | POA: Insufficient documentation

## 2016-10-09 DIAGNOSIS — S81811A Laceration without foreign body, right lower leg, initial encounter: Secondary | ICD-10-CM | POA: Diagnosis not present

## 2016-10-27 DIAGNOSIS — H908 Mixed conductive and sensorineural hearing loss, unspecified: Secondary | ICD-10-CM | POA: Insufficient documentation

## 2016-10-27 DIAGNOSIS — E559 Vitamin D deficiency, unspecified: Secondary | ICD-10-CM | POA: Diagnosis not present

## 2016-10-27 DIAGNOSIS — E7849 Other hyperlipidemia: Secondary | ICD-10-CM | POA: Diagnosis not present

## 2016-10-27 DIAGNOSIS — E0840 Diabetes mellitus due to underlying condition with diabetic neuropathy, unspecified: Secondary | ICD-10-CM | POA: Diagnosis not present

## 2016-10-27 DIAGNOSIS — H9319 Tinnitus, unspecified ear: Secondary | ICD-10-CM | POA: Insufficient documentation

## 2016-10-27 DIAGNOSIS — E1142 Type 2 diabetes mellitus with diabetic polyneuropathy: Secondary | ICD-10-CM | POA: Diagnosis not present

## 2016-10-27 DIAGNOSIS — Z125 Encounter for screening for malignant neoplasm of prostate: Secondary | ICD-10-CM | POA: Diagnosis not present

## 2016-10-27 DIAGNOSIS — E119 Type 2 diabetes mellitus without complications: Secondary | ICD-10-CM | POA: Diagnosis not present

## 2016-11-05 DIAGNOSIS — G4733 Obstructive sleep apnea (adult) (pediatric): Secondary | ICD-10-CM | POA: Diagnosis not present

## 2016-11-20 DIAGNOSIS — G4733 Obstructive sleep apnea (adult) (pediatric): Secondary | ICD-10-CM | POA: Diagnosis not present

## 2016-12-06 DIAGNOSIS — G4733 Obstructive sleep apnea (adult) (pediatric): Secondary | ICD-10-CM | POA: Diagnosis not present

## 2017-01-05 DIAGNOSIS — G4733 Obstructive sleep apnea (adult) (pediatric): Secondary | ICD-10-CM | POA: Diagnosis not present

## 2017-01-12 DIAGNOSIS — E1142 Type 2 diabetes mellitus with diabetic polyneuropathy: Secondary | ICD-10-CM | POA: Diagnosis not present

## 2017-01-12 DIAGNOSIS — J40 Bronchitis, not specified as acute or chronic: Secondary | ICD-10-CM | POA: Diagnosis not present

## 2017-01-12 DIAGNOSIS — E0840 Diabetes mellitus due to underlying condition with diabetic neuropathy, unspecified: Secondary | ICD-10-CM | POA: Diagnosis not present

## 2017-02-05 DIAGNOSIS — G4733 Obstructive sleep apnea (adult) (pediatric): Secondary | ICD-10-CM | POA: Diagnosis not present

## 2017-03-08 DIAGNOSIS — G4733 Obstructive sleep apnea (adult) (pediatric): Secondary | ICD-10-CM | POA: Diagnosis not present

## 2017-03-12 DIAGNOSIS — E119 Type 2 diabetes mellitus without complications: Secondary | ICD-10-CM | POA: Diagnosis not present

## 2017-03-12 DIAGNOSIS — H52223 Regular astigmatism, bilateral: Secondary | ICD-10-CM | POA: Diagnosis not present

## 2017-03-12 DIAGNOSIS — H5203 Hypermetropia, bilateral: Secondary | ICD-10-CM | POA: Diagnosis not present

## 2017-04-08 DIAGNOSIS — J219 Acute bronchiolitis, unspecified: Secondary | ICD-10-CM | POA: Diagnosis not present

## 2017-04-23 DIAGNOSIS — K219 Gastro-esophageal reflux disease without esophagitis: Secondary | ICD-10-CM | POA: Diagnosis not present

## 2017-04-23 DIAGNOSIS — I10 Essential (primary) hypertension: Secondary | ICD-10-CM | POA: Diagnosis not present

## 2017-04-23 DIAGNOSIS — E1340 Other specified diabetes mellitus with diabetic neuropathy, unspecified: Secondary | ICD-10-CM | POA: Diagnosis not present

## 2017-04-23 DIAGNOSIS — E785 Hyperlipidemia, unspecified: Secondary | ICD-10-CM | POA: Diagnosis not present

## 2017-04-23 DIAGNOSIS — G4733 Obstructive sleep apnea (adult) (pediatric): Secondary | ICD-10-CM | POA: Diagnosis not present

## 2017-04-23 DIAGNOSIS — Z0000 Encounter for general adult medical examination without abnormal findings: Secondary | ICD-10-CM | POA: Diagnosis not present

## 2017-04-23 DIAGNOSIS — E559 Vitamin D deficiency, unspecified: Secondary | ICD-10-CM | POA: Diagnosis not present

## 2017-04-23 DIAGNOSIS — E0840 Diabetes mellitus due to underlying condition with diabetic neuropathy, unspecified: Secondary | ICD-10-CM | POA: Diagnosis not present

## 2017-05-08 DIAGNOSIS — K529 Noninfective gastroenteritis and colitis, unspecified: Secondary | ICD-10-CM | POA: Diagnosis not present

## 2017-05-15 DIAGNOSIS — M7061 Trochanteric bursitis, right hip: Secondary | ICD-10-CM | POA: Diagnosis not present

## 2017-09-28 DIAGNOSIS — M778 Other enthesopathies, not elsewhere classified: Secondary | ICD-10-CM | POA: Diagnosis not present

## 2017-09-28 DIAGNOSIS — M19011 Primary osteoarthritis, right shoulder: Secondary | ICD-10-CM | POA: Diagnosis not present

## 2017-09-28 DIAGNOSIS — M25511 Pain in right shoulder: Secondary | ICD-10-CM | POA: Diagnosis not present

## 2017-10-01 DIAGNOSIS — Z1283 Encounter for screening for malignant neoplasm of skin: Secondary | ICD-10-CM | POA: Diagnosis not present

## 2017-10-01 DIAGNOSIS — L570 Actinic keratosis: Secondary | ICD-10-CM | POA: Diagnosis not present

## 2017-10-01 DIAGNOSIS — D225 Melanocytic nevi of trunk: Secondary | ICD-10-CM | POA: Diagnosis not present

## 2017-10-01 DIAGNOSIS — X32XXXA Exposure to sunlight, initial encounter: Secondary | ICD-10-CM | POA: Diagnosis not present

## 2017-10-02 DIAGNOSIS — I25118 Atherosclerotic heart disease of native coronary artery with other forms of angina pectoris: Secondary | ICD-10-CM | POA: Diagnosis not present

## 2017-10-02 DIAGNOSIS — E0840 Diabetes mellitus due to underlying condition with diabetic neuropathy, unspecified: Secondary | ICD-10-CM | POA: Diagnosis not present

## 2017-10-02 DIAGNOSIS — I10 Essential (primary) hypertension: Secondary | ICD-10-CM | POA: Diagnosis not present

## 2017-10-02 DIAGNOSIS — E785 Hyperlipidemia, unspecified: Secondary | ICD-10-CM | POA: Diagnosis not present

## 2017-10-02 DIAGNOSIS — Z951 Presence of aortocoronary bypass graft: Secondary | ICD-10-CM | POA: Diagnosis not present

## 2017-11-21 DIAGNOSIS — Z23 Encounter for immunization: Secondary | ICD-10-CM | POA: Diagnosis not present

## 2018-01-04 DIAGNOSIS — G4733 Obstructive sleep apnea (adult) (pediatric): Secondary | ICD-10-CM | POA: Diagnosis not present

## 2018-06-26 DIAGNOSIS — N3941 Urge incontinence: Secondary | ICD-10-CM | POA: Insufficient documentation

## 2018-10-04 DIAGNOSIS — Z7982 Long term (current) use of aspirin: Secondary | ICD-10-CM | POA: Insufficient documentation

## 2018-12-15 DIAGNOSIS — M1611 Unilateral primary osteoarthritis, right hip: Secondary | ICD-10-CM | POA: Insufficient documentation

## 2018-12-15 DIAGNOSIS — M7061 Trochanteric bursitis, right hip: Secondary | ICD-10-CM | POA: Insufficient documentation

## 2019-01-16 DIAGNOSIS — M1611 Unilateral primary osteoarthritis, right hip: Secondary | ICD-10-CM | POA: Diagnosis not present

## 2019-01-16 DIAGNOSIS — Z7984 Long term (current) use of oral hypoglycemic drugs: Secondary | ICD-10-CM | POA: Diagnosis not present

## 2019-01-16 DIAGNOSIS — M7061 Trochanteric bursitis, right hip: Secondary | ICD-10-CM | POA: Diagnosis not present

## 2019-01-16 DIAGNOSIS — Z791 Long term (current) use of non-steroidal anti-inflammatories (NSAID): Secondary | ICD-10-CM | POA: Diagnosis not present

## 2019-01-16 DIAGNOSIS — E1140 Type 2 diabetes mellitus with diabetic neuropathy, unspecified: Secondary | ICD-10-CM | POA: Diagnosis not present

## 2019-01-16 DIAGNOSIS — I10 Essential (primary) hypertension: Secondary | ICD-10-CM | POA: Diagnosis not present

## 2019-01-27 DIAGNOSIS — G4733 Obstructive sleep apnea (adult) (pediatric): Secondary | ICD-10-CM | POA: Diagnosis not present

## 2019-02-11 DIAGNOSIS — M65332 Trigger finger, left middle finger: Secondary | ICD-10-CM | POA: Diagnosis not present

## 2019-02-24 DIAGNOSIS — M1611 Unilateral primary osteoarthritis, right hip: Secondary | ICD-10-CM | POA: Diagnosis not present

## 2019-03-17 DIAGNOSIS — R799 Abnormal finding of blood chemistry, unspecified: Secondary | ICD-10-CM | POA: Diagnosis not present

## 2019-03-17 DIAGNOSIS — E1165 Type 2 diabetes mellitus with hyperglycemia: Secondary | ICD-10-CM | POA: Diagnosis not present

## 2019-03-17 DIAGNOSIS — M1611 Unilateral primary osteoarthritis, right hip: Secondary | ICD-10-CM | POA: Diagnosis not present

## 2019-03-25 DIAGNOSIS — I10 Essential (primary) hypertension: Secondary | ICD-10-CM | POA: Diagnosis not present

## 2019-03-25 DIAGNOSIS — Z01810 Encounter for preprocedural cardiovascular examination: Secondary | ICD-10-CM | POA: Diagnosis not present

## 2019-03-25 DIAGNOSIS — G4733 Obstructive sleep apnea (adult) (pediatric): Secondary | ICD-10-CM | POA: Diagnosis not present

## 2019-03-25 DIAGNOSIS — E0840 Diabetes mellitus due to underlying condition with diabetic neuropathy, unspecified: Secondary | ICD-10-CM | POA: Diagnosis not present

## 2019-03-25 DIAGNOSIS — I25118 Atherosclerotic heart disease of native coronary artery with other forms of angina pectoris: Secondary | ICD-10-CM | POA: Diagnosis not present

## 2019-03-26 DIAGNOSIS — Z951 Presence of aortocoronary bypass graft: Secondary | ICD-10-CM | POA: Diagnosis not present

## 2019-03-26 DIAGNOSIS — R9431 Abnormal electrocardiogram [ECG] [EKG]: Secondary | ICD-10-CM | POA: Diagnosis not present

## 2019-03-26 DIAGNOSIS — E0840 Diabetes mellitus due to underlying condition with diabetic neuropathy, unspecified: Secondary | ICD-10-CM | POA: Diagnosis not present

## 2019-03-26 DIAGNOSIS — I10 Essential (primary) hypertension: Secondary | ICD-10-CM | POA: Diagnosis not present

## 2019-03-26 DIAGNOSIS — Z7984 Long term (current) use of oral hypoglycemic drugs: Secondary | ICD-10-CM | POA: Diagnosis not present

## 2019-03-26 DIAGNOSIS — Z8674 Personal history of sudden cardiac arrest: Secondary | ICD-10-CM | POA: Diagnosis not present

## 2019-03-26 DIAGNOSIS — Z01810 Encounter for preprocedural cardiovascular examination: Secondary | ICD-10-CM | POA: Diagnosis not present

## 2019-03-26 DIAGNOSIS — Z01812 Encounter for preprocedural laboratory examination: Secondary | ICD-10-CM | POA: Diagnosis not present

## 2019-03-26 DIAGNOSIS — I2510 Atherosclerotic heart disease of native coronary artery without angina pectoris: Secondary | ICD-10-CM | POA: Diagnosis not present

## 2019-03-26 DIAGNOSIS — E785 Hyperlipidemia, unspecified: Secondary | ICD-10-CM | POA: Diagnosis not present

## 2019-03-26 DIAGNOSIS — I252 Old myocardial infarction: Secondary | ICD-10-CM | POA: Diagnosis not present

## 2019-03-26 DIAGNOSIS — E1142 Type 2 diabetes mellitus with diabetic polyneuropathy: Secondary | ICD-10-CM | POA: Diagnosis not present

## 2019-03-26 DIAGNOSIS — I25118 Atherosclerotic heart disease of native coronary artery with other forms of angina pectoris: Secondary | ICD-10-CM | POA: Diagnosis not present

## 2019-03-26 DIAGNOSIS — Z01818 Encounter for other preprocedural examination: Secondary | ICD-10-CM | POA: Diagnosis not present

## 2019-04-02 DIAGNOSIS — Z20822 Contact with and (suspected) exposure to covid-19: Secondary | ICD-10-CM | POA: Diagnosis not present

## 2019-04-02 DIAGNOSIS — M1611 Unilateral primary osteoarthritis, right hip: Secondary | ICD-10-CM | POA: Diagnosis not present

## 2019-04-02 DIAGNOSIS — Z01812 Encounter for preprocedural laboratory examination: Secondary | ICD-10-CM | POA: Diagnosis not present

## 2019-04-07 DIAGNOSIS — Z79899 Other long term (current) drug therapy: Secondary | ICD-10-CM | POA: Diagnosis not present

## 2019-04-07 DIAGNOSIS — F329 Major depressive disorder, single episode, unspecified: Secondary | ICD-10-CM | POA: Diagnosis not present

## 2019-04-07 DIAGNOSIS — Z801 Family history of malignant neoplasm of trachea, bronchus and lung: Secondary | ICD-10-CM | POA: Diagnosis not present

## 2019-04-07 DIAGNOSIS — K219 Gastro-esophageal reflux disease without esophagitis: Secondary | ICD-10-CM | POA: Diagnosis not present

## 2019-04-07 DIAGNOSIS — Z7984 Long term (current) use of oral hypoglycemic drugs: Secondary | ICD-10-CM | POA: Diagnosis not present

## 2019-04-07 DIAGNOSIS — I252 Old myocardial infarction: Secondary | ICD-10-CM | POA: Diagnosis not present

## 2019-04-07 DIAGNOSIS — Z96653 Presence of artificial knee joint, bilateral: Secondary | ICD-10-CM | POA: Diagnosis not present

## 2019-04-07 DIAGNOSIS — G2581 Restless legs syndrome: Secondary | ICD-10-CM | POA: Diagnosis not present

## 2019-04-07 DIAGNOSIS — Z951 Presence of aortocoronary bypass graft: Secondary | ICD-10-CM | POA: Diagnosis not present

## 2019-04-07 DIAGNOSIS — I509 Heart failure, unspecified: Secondary | ICD-10-CM | POA: Diagnosis not present

## 2019-04-07 DIAGNOSIS — M1611 Unilateral primary osteoarthritis, right hip: Secondary | ICD-10-CM | POA: Diagnosis not present

## 2019-04-07 DIAGNOSIS — I2510 Atherosclerotic heart disease of native coronary artery without angina pectoris: Secondary | ICD-10-CM | POA: Diagnosis not present

## 2019-04-07 DIAGNOSIS — E785 Hyperlipidemia, unspecified: Secondary | ICD-10-CM | POA: Diagnosis not present

## 2019-04-07 DIAGNOSIS — M1610 Unilateral primary osteoarthritis, unspecified hip: Secondary | ICD-10-CM | POA: Diagnosis not present

## 2019-04-07 DIAGNOSIS — G4730 Sleep apnea, unspecified: Secondary | ICD-10-CM | POA: Diagnosis not present

## 2019-04-07 DIAGNOSIS — F0390 Unspecified dementia without behavioral disturbance: Secondary | ICD-10-CM | POA: Diagnosis not present

## 2019-04-07 DIAGNOSIS — E119 Type 2 diabetes mellitus without complications: Secondary | ICD-10-CM | POA: Diagnosis not present

## 2019-04-07 DIAGNOSIS — Z833 Family history of diabetes mellitus: Secondary | ICD-10-CM | POA: Diagnosis not present

## 2019-04-07 DIAGNOSIS — Z7982 Long term (current) use of aspirin: Secondary | ICD-10-CM | POA: Diagnosis not present

## 2019-04-09 DIAGNOSIS — K219 Gastro-esophageal reflux disease without esophagitis: Secondary | ICD-10-CM | POA: Diagnosis not present

## 2019-04-09 DIAGNOSIS — E119 Type 2 diabetes mellitus without complications: Secondary | ICD-10-CM | POA: Diagnosis not present

## 2019-04-09 DIAGNOSIS — M1611 Unilateral primary osteoarthritis, right hip: Secondary | ICD-10-CM | POA: Diagnosis not present

## 2019-04-09 DIAGNOSIS — I25810 Atherosclerosis of coronary artery bypass graft(s) without angina pectoris: Secondary | ICD-10-CM | POA: Diagnosis not present

## 2019-04-09 DIAGNOSIS — Z96641 Presence of right artificial hip joint: Secondary | ICD-10-CM | POA: Diagnosis not present

## 2019-04-09 DIAGNOSIS — I10 Essential (primary) hypertension: Secondary | ICD-10-CM | POA: Diagnosis not present

## 2019-04-09 DIAGNOSIS — F329 Major depressive disorder, single episode, unspecified: Secondary | ICD-10-CM | POA: Diagnosis not present

## 2019-04-09 DIAGNOSIS — F0390 Unspecified dementia without behavioral disturbance: Secondary | ICD-10-CM | POA: Diagnosis not present

## 2019-04-12 DIAGNOSIS — F329 Major depressive disorder, single episode, unspecified: Secondary | ICD-10-CM | POA: Diagnosis not present

## 2019-04-12 DIAGNOSIS — F0390 Unspecified dementia without behavioral disturbance: Secondary | ICD-10-CM | POA: Diagnosis not present

## 2019-04-12 DIAGNOSIS — I10 Essential (primary) hypertension: Secondary | ICD-10-CM | POA: Diagnosis not present

## 2019-04-12 DIAGNOSIS — M1611 Unilateral primary osteoarthritis, right hip: Secondary | ICD-10-CM | POA: Diagnosis not present

## 2019-04-12 DIAGNOSIS — E119 Type 2 diabetes mellitus without complications: Secondary | ICD-10-CM | POA: Diagnosis not present

## 2019-04-12 DIAGNOSIS — I25810 Atherosclerosis of coronary artery bypass graft(s) without angina pectoris: Secondary | ICD-10-CM | POA: Diagnosis not present

## 2019-04-12 DIAGNOSIS — K219 Gastro-esophageal reflux disease without esophagitis: Secondary | ICD-10-CM | POA: Diagnosis not present

## 2019-04-12 DIAGNOSIS — Z96641 Presence of right artificial hip joint: Secondary | ICD-10-CM | POA: Diagnosis not present

## 2019-04-21 DIAGNOSIS — Z96641 Presence of right artificial hip joint: Secondary | ICD-10-CM | POA: Diagnosis not present

## 2019-04-21 DIAGNOSIS — Z471 Aftercare following joint replacement surgery: Secondary | ICD-10-CM | POA: Diagnosis not present

## 2019-04-22 DIAGNOSIS — Z96641 Presence of right artificial hip joint: Secondary | ICD-10-CM | POA: Diagnosis not present

## 2019-04-22 DIAGNOSIS — Z471 Aftercare following joint replacement surgery: Secondary | ICD-10-CM | POA: Diagnosis not present

## 2019-05-02 DIAGNOSIS — Z96641 Presence of right artificial hip joint: Secondary | ICD-10-CM | POA: Diagnosis not present

## 2019-05-02 DIAGNOSIS — Z471 Aftercare following joint replacement surgery: Secondary | ICD-10-CM | POA: Diagnosis not present

## 2019-05-07 DIAGNOSIS — Z471 Aftercare following joint replacement surgery: Secondary | ICD-10-CM | POA: Diagnosis not present

## 2019-05-07 DIAGNOSIS — Z96641 Presence of right artificial hip joint: Secondary | ICD-10-CM | POA: Diagnosis not present

## 2019-05-09 DIAGNOSIS — Z0000 Encounter for general adult medical examination without abnormal findings: Secondary | ICD-10-CM | POA: Diagnosis not present

## 2019-05-09 DIAGNOSIS — F3342 Major depressive disorder, recurrent, in full remission: Secondary | ICD-10-CM | POA: Diagnosis not present

## 2019-05-09 DIAGNOSIS — I25118 Atherosclerotic heart disease of native coronary artery with other forms of angina pectoris: Secondary | ICD-10-CM | POA: Diagnosis not present

## 2019-05-09 DIAGNOSIS — E559 Vitamin D deficiency, unspecified: Secondary | ICD-10-CM | POA: Diagnosis not present

## 2019-05-09 DIAGNOSIS — G4733 Obstructive sleep apnea (adult) (pediatric): Secondary | ICD-10-CM | POA: Diagnosis not present

## 2019-05-09 DIAGNOSIS — I10 Essential (primary) hypertension: Secondary | ICD-10-CM | POA: Diagnosis not present

## 2019-05-09 DIAGNOSIS — Z471 Aftercare following joint replacement surgery: Secondary | ICD-10-CM | POA: Diagnosis not present

## 2019-05-09 DIAGNOSIS — E785 Hyperlipidemia, unspecified: Secondary | ICD-10-CM | POA: Diagnosis not present

## 2019-05-09 DIAGNOSIS — R413 Other amnesia: Secondary | ICD-10-CM | POA: Diagnosis not present

## 2019-05-09 DIAGNOSIS — E1140 Type 2 diabetes mellitus with diabetic neuropathy, unspecified: Secondary | ICD-10-CM | POA: Diagnosis not present

## 2019-05-09 DIAGNOSIS — Z96641 Presence of right artificial hip joint: Secondary | ICD-10-CM | POA: Diagnosis not present

## 2019-05-16 DIAGNOSIS — Z471 Aftercare following joint replacement surgery: Secondary | ICD-10-CM | POA: Diagnosis not present

## 2019-05-16 DIAGNOSIS — Z96641 Presence of right artificial hip joint: Secondary | ICD-10-CM | POA: Diagnosis not present

## 2019-05-19 DIAGNOSIS — Z96641 Presence of right artificial hip joint: Secondary | ICD-10-CM | POA: Diagnosis not present

## 2019-05-19 DIAGNOSIS — Z471 Aftercare following joint replacement surgery: Secondary | ICD-10-CM | POA: Diagnosis not present

## 2019-05-21 DIAGNOSIS — Z96641 Presence of right artificial hip joint: Secondary | ICD-10-CM | POA: Diagnosis not present

## 2019-05-21 DIAGNOSIS — Z471 Aftercare following joint replacement surgery: Secondary | ICD-10-CM | POA: Diagnosis not present

## 2019-05-28 DIAGNOSIS — Z96641 Presence of right artificial hip joint: Secondary | ICD-10-CM | POA: Diagnosis not present

## 2019-05-28 DIAGNOSIS — Z471 Aftercare following joint replacement surgery: Secondary | ICD-10-CM | POA: Diagnosis not present

## 2019-06-06 DIAGNOSIS — Z471 Aftercare following joint replacement surgery: Secondary | ICD-10-CM | POA: Diagnosis not present

## 2019-06-06 DIAGNOSIS — Z96641 Presence of right artificial hip joint: Secondary | ICD-10-CM | POA: Diagnosis not present

## 2019-06-13 DIAGNOSIS — Z96641 Presence of right artificial hip joint: Secondary | ICD-10-CM | POA: Diagnosis not present

## 2019-06-13 DIAGNOSIS — Z471 Aftercare following joint replacement surgery: Secondary | ICD-10-CM | POA: Diagnosis not present

## 2019-06-18 DIAGNOSIS — Z471 Aftercare following joint replacement surgery: Secondary | ICD-10-CM | POA: Diagnosis not present

## 2019-06-18 DIAGNOSIS — Z96641 Presence of right artificial hip joint: Secondary | ICD-10-CM | POA: Diagnosis not present

## 2019-06-19 DIAGNOSIS — S76312A Strain of muscle, fascia and tendon of the posterior muscle group at thigh level, left thigh, initial encounter: Secondary | ICD-10-CM | POA: Diagnosis not present

## 2019-06-26 DIAGNOSIS — S76312D Strain of muscle, fascia and tendon of the posterior muscle group at thigh level, left thigh, subsequent encounter: Secondary | ICD-10-CM | POA: Diagnosis not present

## 2019-07-02 DIAGNOSIS — S76312D Strain of muscle, fascia and tendon of the posterior muscle group at thigh level, left thigh, subsequent encounter: Secondary | ICD-10-CM | POA: Diagnosis not present

## 2019-07-03 DIAGNOSIS — D649 Anemia, unspecified: Secondary | ICD-10-CM | POA: Diagnosis not present

## 2019-07-03 DIAGNOSIS — S0003XA Contusion of scalp, initial encounter: Secondary | ICD-10-CM | POA: Diagnosis not present

## 2019-07-03 DIAGNOSIS — M542 Cervicalgia: Secondary | ICD-10-CM | POA: Diagnosis not present

## 2019-07-03 DIAGNOSIS — S060X9A Concussion with loss of consciousness of unspecified duration, initial encounter: Secondary | ICD-10-CM | POA: Diagnosis not present

## 2019-07-03 DIAGNOSIS — S025XXA Fracture of tooth (traumatic), initial encounter for closed fracture: Secondary | ICD-10-CM | POA: Diagnosis not present

## 2019-07-03 DIAGNOSIS — I6503 Occlusion and stenosis of bilateral vertebral arteries: Secondary | ICD-10-CM | POA: Diagnosis not present

## 2019-07-03 DIAGNOSIS — I672 Cerebral atherosclerosis: Secondary | ICD-10-CM | POA: Diagnosis not present

## 2019-07-03 DIAGNOSIS — E1122 Type 2 diabetes mellitus with diabetic chronic kidney disease: Secondary | ICD-10-CM | POA: Diagnosis not present

## 2019-07-03 DIAGNOSIS — Z9989 Dependence on other enabling machines and devices: Secondary | ICD-10-CM | POA: Diagnosis not present

## 2019-07-03 DIAGNOSIS — Z794 Long term (current) use of insulin: Secondary | ICD-10-CM | POA: Diagnosis not present

## 2019-07-03 DIAGNOSIS — M810 Age-related osteoporosis without current pathological fracture: Secondary | ICD-10-CM | POA: Diagnosis not present

## 2019-07-03 DIAGNOSIS — S52101A Unspecified fracture of upper end of right radius, initial encounter for closed fracture: Secondary | ICD-10-CM | POA: Diagnosis not present

## 2019-07-03 DIAGNOSIS — G4730 Sleep apnea, unspecified: Secondary | ICD-10-CM | POA: Diagnosis not present

## 2019-07-03 DIAGNOSIS — J3489 Other specified disorders of nose and nasal sinuses: Secondary | ICD-10-CM | POA: Diagnosis not present

## 2019-07-03 DIAGNOSIS — S0083XA Contusion of other part of head, initial encounter: Secondary | ICD-10-CM | POA: Diagnosis not present

## 2019-07-03 DIAGNOSIS — Z8249 Family history of ischemic heart disease and other diseases of the circulatory system: Secondary | ICD-10-CM | POA: Diagnosis not present

## 2019-07-03 DIAGNOSIS — N3941 Urge incontinence: Secondary | ICD-10-CM | POA: Diagnosis not present

## 2019-07-03 DIAGNOSIS — E785 Hyperlipidemia, unspecified: Secondary | ICD-10-CM | POA: Diagnosis not present

## 2019-07-03 DIAGNOSIS — N400 Enlarged prostate without lower urinary tract symptoms: Secondary | ICD-10-CM | POA: Diagnosis not present

## 2019-07-03 DIAGNOSIS — M7989 Other specified soft tissue disorders: Secondary | ICD-10-CM | POA: Diagnosis not present

## 2019-07-03 DIAGNOSIS — E1136 Type 2 diabetes mellitus with diabetic cataract: Secondary | ICD-10-CM | POA: Diagnosis not present

## 2019-07-03 DIAGNOSIS — I129 Hypertensive chronic kidney disease with stage 1 through stage 4 chronic kidney disease, or unspecified chronic kidney disease: Secondary | ICD-10-CM | POA: Diagnosis not present

## 2019-07-03 DIAGNOSIS — Z7984 Long term (current) use of oral hypoglycemic drugs: Secondary | ICD-10-CM | POA: Diagnosis not present

## 2019-07-03 DIAGNOSIS — W108XXA Fall (on) (from) other stairs and steps, initial encounter: Secondary | ICD-10-CM | POA: Diagnosis not present

## 2019-07-03 DIAGNOSIS — H43392 Other vitreous opacities, left eye: Secondary | ICD-10-CM | POA: Diagnosis not present

## 2019-07-03 DIAGNOSIS — R52 Pain, unspecified: Secondary | ICD-10-CM | POA: Diagnosis not present

## 2019-07-03 DIAGNOSIS — M79621 Pain in right upper arm: Secondary | ICD-10-CM | POA: Diagnosis not present

## 2019-07-03 DIAGNOSIS — Z951 Presence of aortocoronary bypass graft: Secondary | ICD-10-CM | POA: Diagnosis not present

## 2019-07-03 DIAGNOSIS — E11649 Type 2 diabetes mellitus with hypoglycemia without coma: Secondary | ICD-10-CM | POA: Diagnosis not present

## 2019-07-03 DIAGNOSIS — R413 Other amnesia: Secondary | ICD-10-CM | POA: Diagnosis not present

## 2019-07-03 DIAGNOSIS — S0010XA Contusion of unspecified eyelid and periocular area, initial encounter: Secondary | ICD-10-CM | POA: Diagnosis not present

## 2019-07-03 DIAGNOSIS — Z79899 Other long term (current) drug therapy: Secondary | ICD-10-CM | POA: Diagnosis not present

## 2019-07-03 DIAGNOSIS — S52121A Displaced fracture of head of right radius, initial encounter for closed fracture: Secondary | ICD-10-CM | POA: Diagnosis not present

## 2019-07-03 DIAGNOSIS — E1140 Type 2 diabetes mellitus with diabetic neuropathy, unspecified: Secondary | ICD-10-CM | POA: Diagnosis not present

## 2019-07-03 DIAGNOSIS — I2510 Atherosclerotic heart disease of native coronary artery without angina pectoris: Secondary | ICD-10-CM | POA: Diagnosis not present

## 2019-07-03 DIAGNOSIS — R079 Chest pain, unspecified: Secondary | ICD-10-CM | POA: Diagnosis not present

## 2019-07-03 DIAGNOSIS — M1990 Unspecified osteoarthritis, unspecified site: Secondary | ICD-10-CM | POA: Diagnosis not present

## 2019-07-03 DIAGNOSIS — Z96653 Presence of artificial knee joint, bilateral: Secondary | ICD-10-CM | POA: Diagnosis not present

## 2019-07-03 DIAGNOSIS — I252 Old myocardial infarction: Secondary | ICD-10-CM | POA: Diagnosis not present

## 2019-07-03 DIAGNOSIS — N401 Enlarged prostate with lower urinary tract symptoms: Secondary | ICD-10-CM | POA: Diagnosis not present

## 2019-07-03 DIAGNOSIS — W19XXXA Unspecified fall, initial encounter: Secondary | ICD-10-CM | POA: Diagnosis not present

## 2019-07-03 DIAGNOSIS — G319 Degenerative disease of nervous system, unspecified: Secondary | ICD-10-CM | POA: Diagnosis not present

## 2019-07-03 DIAGNOSIS — K219 Gastro-esophageal reflux disease without esophagitis: Secondary | ICD-10-CM | POA: Diagnosis not present

## 2019-07-03 DIAGNOSIS — R410 Disorientation, unspecified: Secondary | ICD-10-CM | POA: Diagnosis not present

## 2019-07-03 DIAGNOSIS — S12110A Anterior displaced Type II dens fracture, initial encounter for closed fracture: Secondary | ICD-10-CM | POA: Diagnosis not present

## 2019-07-03 DIAGNOSIS — S5291XA Unspecified fracture of right forearm, initial encounter for closed fracture: Secondary | ICD-10-CM | POA: Diagnosis not present

## 2019-07-03 DIAGNOSIS — S129XXA Fracture of neck, unspecified, initial encounter: Secondary | ICD-10-CM | POA: Diagnosis not present

## 2019-07-03 DIAGNOSIS — R001 Bradycardia, unspecified: Secondary | ICD-10-CM | POA: Diagnosis not present

## 2019-07-03 DIAGNOSIS — G4733 Obstructive sleep apnea (adult) (pediatric): Secondary | ICD-10-CM | POA: Diagnosis not present

## 2019-07-03 DIAGNOSIS — J811 Chronic pulmonary edema: Secondary | ICD-10-CM | POA: Diagnosis not present

## 2019-07-03 DIAGNOSIS — F0390 Unspecified dementia without behavioral disturbance: Secondary | ICD-10-CM | POA: Diagnosis not present

## 2019-07-03 DIAGNOSIS — M79651 Pain in right thigh: Secondary | ICD-10-CM | POA: Diagnosis not present

## 2019-07-03 DIAGNOSIS — S12100A Unspecified displaced fracture of second cervical vertebra, initial encounter for closed fracture: Secondary | ICD-10-CM | POA: Diagnosis not present

## 2019-07-03 DIAGNOSIS — E7800 Pure hypercholesterolemia, unspecified: Secondary | ICD-10-CM | POA: Diagnosis not present

## 2019-07-03 DIAGNOSIS — S52131A Displaced fracture of neck of right radius, initial encounter for closed fracture: Secondary | ICD-10-CM | POA: Diagnosis not present

## 2019-07-03 DIAGNOSIS — Z96641 Presence of right artificial hip joint: Secondary | ICD-10-CM | POA: Diagnosis not present

## 2019-07-03 DIAGNOSIS — I440 Atrioventricular block, first degree: Secondary | ICD-10-CM | POA: Diagnosis not present

## 2019-07-03 DIAGNOSIS — I10 Essential (primary) hypertension: Secondary | ICD-10-CM | POA: Diagnosis not present

## 2019-07-03 DIAGNOSIS — S0993XA Unspecified injury of face, initial encounter: Secondary | ICD-10-CM | POA: Diagnosis not present

## 2019-07-03 DIAGNOSIS — H43399 Other vitreous opacities, unspecified eye: Secondary | ICD-10-CM | POA: Diagnosis not present

## 2019-07-03 DIAGNOSIS — E1151 Type 2 diabetes mellitus with diabetic peripheral angiopathy without gangrene: Secondary | ICD-10-CM | POA: Diagnosis not present

## 2019-07-03 DIAGNOSIS — H908 Mixed conductive and sensorineural hearing loss, unspecified: Secondary | ICD-10-CM | POA: Diagnosis not present

## 2019-07-03 DIAGNOSIS — R55 Syncope and collapse: Secondary | ICD-10-CM | POA: Diagnosis not present

## 2019-07-03 DIAGNOSIS — N183 Chronic kidney disease, stage 3 unspecified: Secondary | ICD-10-CM | POA: Diagnosis not present

## 2019-07-03 DIAGNOSIS — I517 Cardiomegaly: Secondary | ICD-10-CM | POA: Diagnosis not present

## 2019-07-03 DIAGNOSIS — M2578 Osteophyte, vertebrae: Secondary | ICD-10-CM | POA: Diagnosis not present

## 2019-07-03 DIAGNOSIS — E1142 Type 2 diabetes mellitus with diabetic polyneuropathy: Secondary | ICD-10-CM | POA: Diagnosis not present

## 2019-07-03 DIAGNOSIS — Z6831 Body mass index (BMI) 31.0-31.9, adult: Secondary | ICD-10-CM | POA: Diagnosis not present

## 2019-07-03 DIAGNOSIS — M25532 Pain in left wrist: Secondary | ICD-10-CM | POA: Diagnosis not present

## 2019-07-03 DIAGNOSIS — K08419 Partial loss of teeth due to trauma, unspecified class: Secondary | ICD-10-CM | POA: Diagnosis not present

## 2019-07-03 DIAGNOSIS — S52124A Nondisplaced fracture of head of right radius, initial encounter for closed fracture: Secondary | ICD-10-CM | POA: Diagnosis not present

## 2019-07-03 DIAGNOSIS — F329 Major depressive disorder, single episode, unspecified: Secondary | ICD-10-CM | POA: Diagnosis not present

## 2019-07-03 DIAGNOSIS — H02401 Unspecified ptosis of right eyelid: Secondary | ICD-10-CM | POA: Diagnosis not present

## 2019-07-03 DIAGNOSIS — S12121A Other nondisplaced dens fracture, initial encounter for closed fracture: Secondary | ICD-10-CM | POA: Diagnosis not present

## 2019-07-03 DIAGNOSIS — S12112A Nondisplaced Type II dens fracture, initial encounter for closed fracture: Secondary | ICD-10-CM | POA: Diagnosis not present

## 2019-07-03 DIAGNOSIS — S52134A Nondisplaced fracture of neck of right radius, initial encounter for closed fracture: Secondary | ICD-10-CM | POA: Diagnosis not present

## 2019-07-03 DIAGNOSIS — S79921A Unspecified injury of right thigh, initial encounter: Secondary | ICD-10-CM | POA: Diagnosis not present

## 2019-07-03 DIAGNOSIS — I6523 Occlusion and stenosis of bilateral carotid arteries: Secondary | ICD-10-CM | POA: Diagnosis not present

## 2019-07-03 DIAGNOSIS — E119 Type 2 diabetes mellitus without complications: Secondary | ICD-10-CM | POA: Diagnosis not present

## 2019-07-03 DIAGNOSIS — G2581 Restless legs syndrome: Secondary | ICD-10-CM | POA: Diagnosis not present

## 2019-07-04 DIAGNOSIS — S12112A Nondisplaced Type II dens fracture, initial encounter for closed fracture: Secondary | ICD-10-CM | POA: Insufficient documentation

## 2019-07-04 DIAGNOSIS — R55 Syncope and collapse: Secondary | ICD-10-CM | POA: Insufficient documentation

## 2019-07-04 DIAGNOSIS — S52134A Nondisplaced fracture of neck of right radius, initial encounter for closed fracture: Secondary | ICD-10-CM | POA: Insufficient documentation

## 2019-07-08 DIAGNOSIS — Z96653 Presence of artificial knee joint, bilateral: Secondary | ICD-10-CM | POA: Diagnosis not present

## 2019-07-08 DIAGNOSIS — M1990 Unspecified osteoarthritis, unspecified site: Secondary | ICD-10-CM | POA: Diagnosis not present

## 2019-07-08 DIAGNOSIS — H02401 Unspecified ptosis of right eyelid: Secondary | ICD-10-CM | POA: Diagnosis not present

## 2019-07-08 DIAGNOSIS — E785 Hyperlipidemia, unspecified: Secondary | ICD-10-CM | POA: Diagnosis not present

## 2019-07-08 DIAGNOSIS — I10 Essential (primary) hypertension: Secondary | ICD-10-CM | POA: Diagnosis not present

## 2019-07-08 DIAGNOSIS — Z79899 Other long term (current) drug therapy: Secondary | ICD-10-CM | POA: Diagnosis not present

## 2019-07-08 DIAGNOSIS — I252 Old myocardial infarction: Secondary | ICD-10-CM | POA: Diagnosis not present

## 2019-07-08 DIAGNOSIS — S0010XA Contusion of unspecified eyelid and periocular area, initial encounter: Secondary | ICD-10-CM | POA: Diagnosis not present

## 2019-07-08 DIAGNOSIS — S025XXA Fracture of tooth (traumatic), initial encounter for closed fracture: Secondary | ICD-10-CM | POA: Diagnosis not present

## 2019-07-08 DIAGNOSIS — S12110A Anterior displaced Type II dens fracture, initial encounter for closed fracture: Secondary | ICD-10-CM | POA: Diagnosis not present

## 2019-07-08 DIAGNOSIS — F329 Major depressive disorder, single episode, unspecified: Secondary | ICD-10-CM | POA: Diagnosis not present

## 2019-07-08 DIAGNOSIS — Z794 Long term (current) use of insulin: Secondary | ICD-10-CM | POA: Diagnosis not present

## 2019-07-08 DIAGNOSIS — E1151 Type 2 diabetes mellitus with diabetic peripheral angiopathy without gangrene: Secondary | ICD-10-CM | POA: Diagnosis not present

## 2019-07-08 DIAGNOSIS — K219 Gastro-esophageal reflux disease without esophagitis: Secondary | ICD-10-CM | POA: Diagnosis not present

## 2019-07-08 DIAGNOSIS — Z951 Presence of aortocoronary bypass graft: Secondary | ICD-10-CM | POA: Diagnosis not present

## 2019-07-08 DIAGNOSIS — E1136 Type 2 diabetes mellitus with diabetic cataract: Secondary | ICD-10-CM | POA: Diagnosis not present

## 2019-07-08 DIAGNOSIS — E1142 Type 2 diabetes mellitus with diabetic polyneuropathy: Secondary | ICD-10-CM | POA: Diagnosis not present

## 2019-07-08 DIAGNOSIS — S52101A Unspecified fracture of upper end of right radius, initial encounter for closed fracture: Secondary | ICD-10-CM | POA: Diagnosis not present

## 2019-07-08 DIAGNOSIS — Z96641 Presence of right artificial hip joint: Secondary | ICD-10-CM | POA: Diagnosis not present

## 2019-07-08 DIAGNOSIS — Z8249 Family history of ischemic heart disease and other diseases of the circulatory system: Secondary | ICD-10-CM | POA: Diagnosis not present

## 2019-07-08 DIAGNOSIS — N400 Enlarged prostate without lower urinary tract symptoms: Secondary | ICD-10-CM | POA: Diagnosis not present

## 2019-07-08 DIAGNOSIS — I2510 Atherosclerotic heart disease of native coronary artery without angina pectoris: Secondary | ICD-10-CM | POA: Diagnosis not present

## 2019-07-08 DIAGNOSIS — M810 Age-related osteoporosis without current pathological fracture: Secondary | ICD-10-CM | POA: Diagnosis not present

## 2019-07-08 DIAGNOSIS — G4730 Sleep apnea, unspecified: Secondary | ICD-10-CM | POA: Diagnosis not present

## 2019-07-08 DIAGNOSIS — H43392 Other vitreous opacities, left eye: Secondary | ICD-10-CM | POA: Diagnosis not present

## 2019-07-10 DIAGNOSIS — R55 Syncope and collapse: Secondary | ICD-10-CM | POA: Diagnosis not present

## 2019-07-10 DIAGNOSIS — F0390 Unspecified dementia without behavioral disturbance: Secondary | ICD-10-CM | POA: Diagnosis not present

## 2019-07-10 DIAGNOSIS — S52134D Nondisplaced fracture of neck of right radius, subsequent encounter for closed fracture with routine healing: Secondary | ICD-10-CM | POA: Diagnosis not present

## 2019-07-10 DIAGNOSIS — I129 Hypertensive chronic kidney disease with stage 1 through stage 4 chronic kidney disease, or unspecified chronic kidney disease: Secondary | ICD-10-CM | POA: Diagnosis not present

## 2019-07-10 DIAGNOSIS — S0083XD Contusion of other part of head, subsequent encounter: Secondary | ICD-10-CM | POA: Diagnosis not present

## 2019-07-10 DIAGNOSIS — N183 Chronic kidney disease, stage 3 unspecified: Secondary | ICD-10-CM | POA: Diagnosis not present

## 2019-07-10 DIAGNOSIS — E1140 Type 2 diabetes mellitus with diabetic neuropathy, unspecified: Secondary | ICD-10-CM | POA: Diagnosis not present

## 2019-07-10 DIAGNOSIS — I2510 Atherosclerotic heart disease of native coronary artery without angina pectoris: Secondary | ICD-10-CM | POA: Diagnosis not present

## 2019-07-10 DIAGNOSIS — S12112D Nondisplaced Type II dens fracture, subsequent encounter for fracture with routine healing: Secondary | ICD-10-CM | POA: Diagnosis not present

## 2019-07-11 DIAGNOSIS — S12112D Nondisplaced Type II dens fracture, subsequent encounter for fracture with routine healing: Secondary | ICD-10-CM | POA: Diagnosis not present

## 2019-07-11 DIAGNOSIS — W108XXD Fall (on) (from) other stairs and steps, subsequent encounter: Secondary | ICD-10-CM | POA: Diagnosis not present

## 2019-07-11 DIAGNOSIS — S52134D Nondisplaced fracture of neck of right radius, subsequent encounter for closed fracture with routine healing: Secondary | ICD-10-CM | POA: Diagnosis not present

## 2019-07-11 DIAGNOSIS — Y92009 Unspecified place in unspecified non-institutional (private) residence as the place of occurrence of the external cause: Secondary | ICD-10-CM | POA: Diagnosis not present

## 2019-07-11 DIAGNOSIS — S0083XD Contusion of other part of head, subsequent encounter: Secondary | ICD-10-CM | POA: Diagnosis not present

## 2019-07-11 DIAGNOSIS — H539 Unspecified visual disturbance: Secondary | ICD-10-CM | POA: Diagnosis not present

## 2019-07-11 DIAGNOSIS — E1140 Type 2 diabetes mellitus with diabetic neuropathy, unspecified: Secondary | ICD-10-CM | POA: Diagnosis not present

## 2019-07-15 DIAGNOSIS — K08109 Complete loss of teeth, unspecified cause, unspecified class: Secondary | ICD-10-CM | POA: Diagnosis not present

## 2019-07-22 DIAGNOSIS — S52134D Nondisplaced fracture of neck of right radius, subsequent encounter for closed fracture with routine healing: Secondary | ICD-10-CM | POA: Diagnosis not present

## 2019-07-28 DIAGNOSIS — Z95818 Presence of other cardiac implants and grafts: Secondary | ICD-10-CM | POA: Diagnosis not present

## 2019-07-28 DIAGNOSIS — R55 Syncope and collapse: Secondary | ICD-10-CM | POA: Diagnosis not present

## 2019-08-06 DIAGNOSIS — S52134D Nondisplaced fracture of neck of right radius, subsequent encounter for closed fracture with routine healing: Secondary | ICD-10-CM | POA: Diagnosis not present

## 2019-08-06 DIAGNOSIS — M542 Cervicalgia: Secondary | ICD-10-CM | POA: Diagnosis not present

## 2019-08-11 DIAGNOSIS — I10 Essential (primary) hypertension: Secondary | ICD-10-CM | POA: Diagnosis not present

## 2019-08-11 DIAGNOSIS — I2510 Atherosclerotic heart disease of native coronary artery without angina pectoris: Secondary | ICD-10-CM | POA: Diagnosis not present

## 2019-08-11 DIAGNOSIS — S12112D Nondisplaced Type II dens fracture, subsequent encounter for fracture with routine healing: Secondary | ICD-10-CM | POA: Diagnosis not present

## 2019-08-11 DIAGNOSIS — R55 Syncope and collapse: Secondary | ICD-10-CM | POA: Diagnosis not present

## 2019-08-11 DIAGNOSIS — Z7982 Long term (current) use of aspirin: Secondary | ICD-10-CM | POA: Diagnosis not present

## 2019-08-11 DIAGNOSIS — Z951 Presence of aortocoronary bypass graft: Secondary | ICD-10-CM | POA: Diagnosis not present

## 2019-08-11 DIAGNOSIS — Z95818 Presence of other cardiac implants and grafts: Secondary | ICD-10-CM | POA: Diagnosis not present

## 2019-08-11 DIAGNOSIS — G4733 Obstructive sleep apnea (adult) (pediatric): Secondary | ICD-10-CM | POA: Diagnosis not present

## 2019-08-11 DIAGNOSIS — G2581 Restless legs syndrome: Secondary | ICD-10-CM | POA: Diagnosis not present

## 2019-08-11 DIAGNOSIS — S52134S Nondisplaced fracture of neck of right radius, sequela: Secondary | ICD-10-CM | POA: Diagnosis not present

## 2019-08-18 DIAGNOSIS — K08109 Complete loss of teeth, unspecified cause, unspecified class: Secondary | ICD-10-CM | POA: Diagnosis not present

## 2019-08-19 DIAGNOSIS — L820 Inflamed seborrheic keratosis: Secondary | ICD-10-CM | POA: Diagnosis not present

## 2019-08-19 DIAGNOSIS — B078 Other viral warts: Secondary | ICD-10-CM | POA: Diagnosis not present

## 2019-08-19 DIAGNOSIS — K08109 Complete loss of teeth, unspecified cause, unspecified class: Secondary | ICD-10-CM | POA: Diagnosis not present

## 2019-08-19 DIAGNOSIS — Z1283 Encounter for screening for malignant neoplasm of skin: Secondary | ICD-10-CM | POA: Diagnosis not present

## 2019-08-19 DIAGNOSIS — L218 Other seborrheic dermatitis: Secondary | ICD-10-CM | POA: Diagnosis not present

## 2019-08-26 DIAGNOSIS — M19032 Primary osteoarthritis, left wrist: Secondary | ICD-10-CM | POA: Diagnosis not present

## 2019-08-26 DIAGNOSIS — M65132 Other infective (teno)synovitis, left wrist: Secondary | ICD-10-CM | POA: Diagnosis not present

## 2019-08-26 DIAGNOSIS — S52134D Nondisplaced fracture of neck of right radius, subsequent encounter for closed fracture with routine healing: Secondary | ICD-10-CM | POA: Diagnosis not present

## 2019-09-03 DIAGNOSIS — M4312 Spondylolisthesis, cervical region: Secondary | ICD-10-CM | POA: Diagnosis not present

## 2019-09-03 DIAGNOSIS — S12090D Other displaced fracture of first cervical vertebra, subsequent encounter for fracture with routine healing: Secondary | ICD-10-CM | POA: Diagnosis not present

## 2019-09-03 DIAGNOSIS — S52134D Nondisplaced fracture of neck of right radius, subsequent encounter for closed fracture with routine healing: Secondary | ICD-10-CM | POA: Diagnosis not present

## 2019-10-01 DIAGNOSIS — S52134D Nondisplaced fracture of neck of right radius, subsequent encounter for closed fracture with routine healing: Secondary | ICD-10-CM | POA: Diagnosis not present

## 2019-10-01 DIAGNOSIS — S52134S Nondisplaced fracture of neck of right radius, sequela: Secondary | ICD-10-CM | POA: Diagnosis not present

## 2019-10-01 DIAGNOSIS — M542 Cervicalgia: Secondary | ICD-10-CM | POA: Diagnosis not present

## 2019-10-03 DIAGNOSIS — S52134D Nondisplaced fracture of neck of right radius, subsequent encounter for closed fracture with routine healing: Secondary | ICD-10-CM | POA: Diagnosis not present

## 2019-10-03 DIAGNOSIS — M25521 Pain in right elbow: Secondary | ICD-10-CM | POA: Diagnosis not present

## 2019-10-10 DIAGNOSIS — Z9889 Other specified postprocedural states: Secondary | ICD-10-CM | POA: Diagnosis not present

## 2019-10-13 DIAGNOSIS — H18413 Arcus senilis, bilateral: Secondary | ICD-10-CM | POA: Diagnosis not present

## 2019-10-13 DIAGNOSIS — H2513 Age-related nuclear cataract, bilateral: Secondary | ICD-10-CM | POA: Diagnosis not present

## 2019-10-13 DIAGNOSIS — H40033 Anatomical narrow angle, bilateral: Secondary | ICD-10-CM | POA: Diagnosis not present

## 2019-10-13 DIAGNOSIS — H524 Presbyopia: Secondary | ICD-10-CM | POA: Diagnosis not present

## 2019-10-13 DIAGNOSIS — H5212 Myopia, left eye: Secondary | ICD-10-CM | POA: Diagnosis not present

## 2019-10-14 DIAGNOSIS — Z23 Encounter for immunization: Secondary | ICD-10-CM | POA: Diagnosis not present

## 2019-10-16 DIAGNOSIS — H40031 Anatomical narrow angle, right eye: Secondary | ICD-10-CM | POA: Diagnosis not present

## 2019-10-23 DIAGNOSIS — H40032 Anatomical narrow angle, left eye: Secondary | ICD-10-CM | POA: Diagnosis not present

## 2019-10-30 DIAGNOSIS — H5201 Hypermetropia, right eye: Secondary | ICD-10-CM | POA: Diagnosis not present

## 2019-10-30 DIAGNOSIS — H5212 Myopia, left eye: Secondary | ICD-10-CM | POA: Diagnosis not present

## 2019-10-30 DIAGNOSIS — Z794 Long term (current) use of insulin: Secondary | ICD-10-CM | POA: Diagnosis not present

## 2019-10-30 DIAGNOSIS — H43813 Vitreous degeneration, bilateral: Secondary | ICD-10-CM | POA: Diagnosis not present

## 2019-10-30 DIAGNOSIS — E119 Type 2 diabetes mellitus without complications: Secondary | ICD-10-CM | POA: Diagnosis not present

## 2019-10-30 DIAGNOSIS — H2513 Age-related nuclear cataract, bilateral: Secondary | ICD-10-CM | POA: Diagnosis not present

## 2019-10-30 DIAGNOSIS — H524 Presbyopia: Secondary | ICD-10-CM | POA: Diagnosis not present

## 2019-11-05 DIAGNOSIS — S12000D Unspecified displaced fracture of first cervical vertebra, subsequent encounter for fracture with routine healing: Secondary | ICD-10-CM | POA: Diagnosis not present

## 2019-11-05 DIAGNOSIS — S52134D Nondisplaced fracture of neck of right radius, subsequent encounter for closed fracture with routine healing: Secondary | ICD-10-CM | POA: Diagnosis not present

## 2019-11-05 DIAGNOSIS — F3342 Major depressive disorder, recurrent, in full remission: Secondary | ICD-10-CM | POA: Diagnosis not present

## 2019-11-05 DIAGNOSIS — E785 Hyperlipidemia, unspecified: Secondary | ICD-10-CM | POA: Diagnosis not present

## 2019-11-05 DIAGNOSIS — R413 Other amnesia: Secondary | ICD-10-CM | POA: Diagnosis not present

## 2019-11-05 DIAGNOSIS — E1140 Type 2 diabetes mellitus with diabetic neuropathy, unspecified: Secondary | ICD-10-CM | POA: Diagnosis not present

## 2019-11-05 DIAGNOSIS — I25118 Atherosclerotic heart disease of native coronary artery with other forms of angina pectoris: Secondary | ICD-10-CM | POA: Diagnosis not present

## 2019-11-05 DIAGNOSIS — N401 Enlarged prostate with lower urinary tract symptoms: Secondary | ICD-10-CM | POA: Diagnosis not present

## 2019-11-05 DIAGNOSIS — N138 Other obstructive and reflux uropathy: Secondary | ICD-10-CM | POA: Diagnosis not present

## 2019-11-05 DIAGNOSIS — D649 Anemia, unspecified: Secondary | ICD-10-CM | POA: Diagnosis not present

## 2019-11-05 DIAGNOSIS — Z7984 Long term (current) use of oral hypoglycemic drugs: Secondary | ICD-10-CM | POA: Diagnosis not present

## 2019-11-05 DIAGNOSIS — G4733 Obstructive sleep apnea (adult) (pediatric): Secondary | ICD-10-CM | POA: Diagnosis not present

## 2019-11-05 DIAGNOSIS — I10 Essential (primary) hypertension: Secondary | ICD-10-CM | POA: Diagnosis not present

## 2020-01-06 DIAGNOSIS — G4733 Obstructive sleep apnea (adult) (pediatric): Secondary | ICD-10-CM | POA: Diagnosis not present

## 2020-01-06 DIAGNOSIS — Z95818 Presence of other cardiac implants and grafts: Secondary | ICD-10-CM | POA: Diagnosis not present

## 2020-01-06 DIAGNOSIS — Z951 Presence of aortocoronary bypass graft: Secondary | ICD-10-CM | POA: Diagnosis not present

## 2020-01-06 DIAGNOSIS — I2510 Atherosclerotic heart disease of native coronary artery without angina pectoris: Secondary | ICD-10-CM | POA: Diagnosis not present

## 2020-01-06 DIAGNOSIS — R55 Syncope and collapse: Secondary | ICD-10-CM | POA: Diagnosis not present

## 2020-01-06 DIAGNOSIS — E1140 Type 2 diabetes mellitus with diabetic neuropathy, unspecified: Secondary | ICD-10-CM | POA: Diagnosis not present

## 2020-01-06 DIAGNOSIS — I10 Essential (primary) hypertension: Secondary | ICD-10-CM | POA: Diagnosis not present

## 2020-01-06 DIAGNOSIS — R413 Other amnesia: Secondary | ICD-10-CM | POA: Diagnosis not present

## 2020-01-06 DIAGNOSIS — I25118 Atherosclerotic heart disease of native coronary artery with other forms of angina pectoris: Secondary | ICD-10-CM | POA: Diagnosis not present

## 2020-01-06 DIAGNOSIS — E785 Hyperlipidemia, unspecified: Secondary | ICD-10-CM | POA: Diagnosis not present

## 2020-01-11 DIAGNOSIS — Z4509 Encounter for adjustment and management of other cardiac device: Secondary | ICD-10-CM | POA: Diagnosis not present

## 2020-01-11 DIAGNOSIS — R55 Syncope and collapse: Secondary | ICD-10-CM | POA: Diagnosis not present

## 2020-01-29 DIAGNOSIS — E1142 Type 2 diabetes mellitus with diabetic polyneuropathy: Secondary | ICD-10-CM | POA: Diagnosis not present

## 2020-01-29 DIAGNOSIS — S12112S Nondisplaced Type II dens fracture, sequela: Secondary | ICD-10-CM | POA: Diagnosis not present

## 2020-01-29 DIAGNOSIS — G609 Hereditary and idiopathic neuropathy, unspecified: Secondary | ICD-10-CM | POA: Diagnosis not present

## 2020-01-29 DIAGNOSIS — J302 Other seasonal allergic rhinitis: Secondary | ICD-10-CM | POA: Insufficient documentation

## 2020-01-29 DIAGNOSIS — R058 Other specified cough: Secondary | ICD-10-CM | POA: Insufficient documentation

## 2020-01-29 DIAGNOSIS — R413 Other amnesia: Secondary | ICD-10-CM | POA: Diagnosis not present

## 2020-02-04 DIAGNOSIS — Z85828 Personal history of other malignant neoplasm of skin: Secondary | ICD-10-CM | POA: Diagnosis not present

## 2020-02-04 DIAGNOSIS — Z08 Encounter for follow-up examination after completed treatment for malignant neoplasm: Secondary | ICD-10-CM | POA: Diagnosis not present

## 2020-02-04 DIAGNOSIS — L820 Inflamed seborrheic keratosis: Secondary | ICD-10-CM | POA: Diagnosis not present

## 2020-02-04 DIAGNOSIS — L218 Other seborrheic dermatitis: Secondary | ICD-10-CM | POA: Diagnosis not present

## 2020-02-04 DIAGNOSIS — Z1283 Encounter for screening for malignant neoplasm of skin: Secondary | ICD-10-CM | POA: Diagnosis not present

## 2020-02-04 DIAGNOSIS — D225 Melanocytic nevi of trunk: Secondary | ICD-10-CM | POA: Diagnosis not present

## 2020-02-04 DIAGNOSIS — B078 Other viral warts: Secondary | ICD-10-CM | POA: Diagnosis not present

## 2020-02-20 DIAGNOSIS — S52131A Displaced fracture of neck of right radius, initial encounter for closed fracture: Secondary | ICD-10-CM | POA: Diagnosis not present

## 2020-02-20 DIAGNOSIS — M25521 Pain in right elbow: Secondary | ICD-10-CM | POA: Diagnosis not present

## 2020-02-23 ENCOUNTER — Other Ambulatory Visit: Payer: Self-pay

## 2020-02-23 DIAGNOSIS — Z20822 Contact with and (suspected) exposure to covid-19: Secondary | ICD-10-CM

## 2020-02-24 LAB — SARS-COV-2, NAA 2 DAY TAT

## 2020-02-24 LAB — NOVEL CORONAVIRUS, NAA: SARS-CoV-2, NAA: NOT DETECTED

## 2020-03-12 DIAGNOSIS — S52134K Nondisplaced fracture of neck of right radius, subsequent encounter for closed fracture with nonunion: Secondary | ICD-10-CM | POA: Diagnosis not present

## 2020-03-15 DIAGNOSIS — E1142 Type 2 diabetes mellitus with diabetic polyneuropathy: Secondary | ICD-10-CM | POA: Diagnosis not present

## 2020-03-15 DIAGNOSIS — F0150 Vascular dementia without behavioral disturbance: Secondary | ICD-10-CM | POA: Diagnosis not present

## 2020-03-15 DIAGNOSIS — R413 Other amnesia: Secondary | ICD-10-CM | POA: Diagnosis not present

## 2020-03-15 DIAGNOSIS — G609 Hereditary and idiopathic neuropathy, unspecified: Secondary | ICD-10-CM | POA: Diagnosis not present

## 2020-03-15 DIAGNOSIS — S12112S Nondisplaced Type II dens fracture, sequela: Secondary | ICD-10-CM | POA: Diagnosis not present

## 2020-03-15 DIAGNOSIS — I679 Cerebrovascular disease, unspecified: Secondary | ICD-10-CM | POA: Diagnosis not present

## 2020-03-15 DIAGNOSIS — F0280 Dementia in other diseases classified elsewhere without behavioral disturbance: Secondary | ICD-10-CM | POA: Diagnosis not present

## 2020-03-15 DIAGNOSIS — G309 Alzheimer's disease, unspecified: Secondary | ICD-10-CM | POA: Diagnosis not present

## 2020-03-16 DIAGNOSIS — S12101D Unspecified nondisplaced fracture of second cervical vertebra, subsequent encounter for fracture with routine healing: Secondary | ICD-10-CM | POA: Diagnosis not present

## 2020-03-16 DIAGNOSIS — M542 Cervicalgia: Secondary | ICD-10-CM | POA: Diagnosis not present

## 2020-03-18 DIAGNOSIS — M545 Low back pain, unspecified: Secondary | ICD-10-CM | POA: Diagnosis not present

## 2020-03-18 DIAGNOSIS — Z96641 Presence of right artificial hip joint: Secondary | ICD-10-CM | POA: Diagnosis not present

## 2020-03-31 DIAGNOSIS — S12120K Other displaced dens fracture, subsequent encounter for fracture with nonunion: Secondary | ICD-10-CM | POA: Diagnosis not present

## 2020-03-31 DIAGNOSIS — S12112D Nondisplaced Type II dens fracture, subsequent encounter for fracture with routine healing: Secondary | ICD-10-CM | POA: Diagnosis not present

## 2020-03-31 DIAGNOSIS — M47812 Spondylosis without myelopathy or radiculopathy, cervical region: Secondary | ICD-10-CM | POA: Diagnosis not present

## 2020-03-31 DIAGNOSIS — M4802 Spinal stenosis, cervical region: Secondary | ICD-10-CM | POA: Diagnosis not present

## 2020-03-31 DIAGNOSIS — M4322 Fusion of spine, cervical region: Secondary | ICD-10-CM | POA: Diagnosis not present

## 2020-04-08 DIAGNOSIS — S12101K Unspecified nondisplaced fracture of second cervical vertebra, subsequent encounter for fracture with nonunion: Secondary | ICD-10-CM | POA: Diagnosis not present

## 2020-04-13 DIAGNOSIS — Z4509 Encounter for adjustment and management of other cardiac device: Secondary | ICD-10-CM | POA: Diagnosis not present

## 2020-04-13 DIAGNOSIS — R55 Syncope and collapse: Secondary | ICD-10-CM | POA: Diagnosis not present

## 2020-04-14 DIAGNOSIS — R001 Bradycardia, unspecified: Secondary | ICD-10-CM | POA: Diagnosis not present

## 2020-04-14 DIAGNOSIS — G4733 Obstructive sleep apnea (adult) (pediatric): Secondary | ICD-10-CM | POA: Diagnosis not present

## 2020-04-14 DIAGNOSIS — E1140 Type 2 diabetes mellitus with diabetic neuropathy, unspecified: Secondary | ICD-10-CM | POA: Diagnosis not present

## 2020-04-14 DIAGNOSIS — I10 Essential (primary) hypertension: Secondary | ICD-10-CM | POA: Diagnosis not present

## 2020-04-14 DIAGNOSIS — Z95818 Presence of other cardiac implants and grafts: Secondary | ICD-10-CM | POA: Diagnosis not present

## 2020-04-14 DIAGNOSIS — E785 Hyperlipidemia, unspecified: Secondary | ICD-10-CM | POA: Diagnosis not present

## 2020-04-14 DIAGNOSIS — R55 Syncope and collapse: Secondary | ICD-10-CM | POA: Diagnosis not present

## 2020-04-14 DIAGNOSIS — Z951 Presence of aortocoronary bypass graft: Secondary | ICD-10-CM | POA: Diagnosis not present

## 2020-04-14 DIAGNOSIS — I25118 Atherosclerotic heart disease of native coronary artery with other forms of angina pectoris: Secondary | ICD-10-CM | POA: Diagnosis not present

## 2020-04-15 DIAGNOSIS — G4733 Obstructive sleep apnea (adult) (pediatric): Secondary | ICD-10-CM | POA: Diagnosis not present

## 2020-04-15 DIAGNOSIS — I25118 Atherosclerotic heart disease of native coronary artery with other forms of angina pectoris: Secondary | ICD-10-CM | POA: Diagnosis not present

## 2020-04-15 DIAGNOSIS — Z8781 Personal history of (healed) traumatic fracture: Secondary | ICD-10-CM | POA: Diagnosis not present

## 2020-04-15 DIAGNOSIS — F3342 Major depressive disorder, recurrent, in full remission: Secondary | ICD-10-CM | POA: Diagnosis not present

## 2020-04-15 DIAGNOSIS — E785 Hyperlipidemia, unspecified: Secondary | ICD-10-CM | POA: Diagnosis not present

## 2020-04-15 DIAGNOSIS — E1140 Type 2 diabetes mellitus with diabetic neuropathy, unspecified: Secondary | ICD-10-CM | POA: Diagnosis not present

## 2020-04-15 DIAGNOSIS — R413 Other amnesia: Secondary | ICD-10-CM | POA: Diagnosis not present

## 2020-04-15 DIAGNOSIS — Z95818 Presence of other cardiac implants and grafts: Secondary | ICD-10-CM | POA: Diagnosis not present

## 2020-04-15 DIAGNOSIS — Z951 Presence of aortocoronary bypass graft: Secondary | ICD-10-CM | POA: Diagnosis not present

## 2020-04-15 DIAGNOSIS — E559 Vitamin D deficiency, unspecified: Secondary | ICD-10-CM | POA: Diagnosis not present

## 2020-04-15 DIAGNOSIS — I10 Essential (primary) hypertension: Secondary | ICD-10-CM | POA: Diagnosis not present

## 2020-04-15 DIAGNOSIS — D649 Anemia, unspecified: Secondary | ICD-10-CM | POA: Diagnosis not present

## 2020-04-16 DIAGNOSIS — D649 Anemia, unspecified: Secondary | ICD-10-CM | POA: Insufficient documentation

## 2020-04-16 DIAGNOSIS — R748 Abnormal levels of other serum enzymes: Secondary | ICD-10-CM | POA: Insufficient documentation

## 2020-04-20 DIAGNOSIS — M810 Age-related osteoporosis without current pathological fracture: Secondary | ICD-10-CM | POA: Diagnosis not present

## 2020-04-20 DIAGNOSIS — M85852 Other specified disorders of bone density and structure, left thigh: Secondary | ICD-10-CM | POA: Diagnosis not present

## 2020-04-21 DIAGNOSIS — M8589 Other specified disorders of bone density and structure, multiple sites: Secondary | ICD-10-CM | POA: Insufficient documentation

## 2020-05-07 DIAGNOSIS — S12110K Anterior displaced Type II dens fracture, subsequent encounter for fracture with nonunion: Secondary | ICD-10-CM | POA: Diagnosis not present

## 2020-05-10 DIAGNOSIS — M4322 Fusion of spine, cervical region: Secondary | ICD-10-CM | POA: Diagnosis not present

## 2020-05-10 DIAGNOSIS — Z951 Presence of aortocoronary bypass graft: Secondary | ICD-10-CM | POA: Diagnosis not present

## 2020-05-10 DIAGNOSIS — Z9181 History of falling: Secondary | ICD-10-CM | POA: Diagnosis not present

## 2020-05-10 DIAGNOSIS — I252 Old myocardial infarction: Secondary | ICD-10-CM | POA: Diagnosis not present

## 2020-05-10 DIAGNOSIS — M4316 Spondylolisthesis, lumbar region: Secondary | ICD-10-CM | POA: Diagnosis not present

## 2020-05-10 DIAGNOSIS — M47812 Spondylosis without myelopathy or radiculopathy, cervical region: Secondary | ICD-10-CM | POA: Diagnosis not present

## 2020-05-10 DIAGNOSIS — S12100A Unspecified displaced fracture of second cervical vertebra, initial encounter for closed fracture: Secondary | ICD-10-CM | POA: Diagnosis not present

## 2020-05-10 DIAGNOSIS — S12110K Anterior displaced Type II dens fracture, subsequent encounter for fracture with nonunion: Secondary | ICD-10-CM | POA: Diagnosis not present

## 2020-05-10 DIAGNOSIS — I2510 Atherosclerotic heart disease of native coronary artery without angina pectoris: Secondary | ICD-10-CM | POA: Diagnosis not present

## 2020-05-10 DIAGNOSIS — G309 Alzheimer's disease, unspecified: Secondary | ICD-10-CM | POA: Diagnosis not present

## 2020-05-10 DIAGNOSIS — M4312 Spondylolisthesis, cervical region: Secondary | ICD-10-CM | POA: Diagnosis not present

## 2020-05-10 DIAGNOSIS — S12112K Nondisplaced Type II dens fracture, subsequent encounter for fracture with nonunion: Secondary | ICD-10-CM | POA: Diagnosis not present

## 2020-05-10 DIAGNOSIS — R2689 Other abnormalities of gait and mobility: Secondary | ICD-10-CM | POA: Diagnosis not present

## 2020-05-10 DIAGNOSIS — M47814 Spondylosis without myelopathy or radiculopathy, thoracic region: Secondary | ICD-10-CM | POA: Diagnosis not present

## 2020-05-10 DIAGNOSIS — F0280 Dementia in other diseases classified elsewhere without behavioral disturbance: Secondary | ICD-10-CM | POA: Diagnosis not present

## 2020-05-10 DIAGNOSIS — Z981 Arthrodesis status: Secondary | ICD-10-CM | POA: Diagnosis not present

## 2020-05-12 DIAGNOSIS — Z981 Arthrodesis status: Secondary | ICD-10-CM | POA: Diagnosis not present

## 2020-05-12 DIAGNOSIS — S12100A Unspecified displaced fracture of second cervical vertebra, initial encounter for closed fracture: Secondary | ICD-10-CM | POA: Diagnosis not present

## 2020-05-12 DIAGNOSIS — S12112K Nondisplaced Type II dens fracture, subsequent encounter for fracture with nonunion: Secondary | ICD-10-CM | POA: Diagnosis not present

## 2020-05-12 DIAGNOSIS — M4322 Fusion of spine, cervical region: Secondary | ICD-10-CM | POA: Diagnosis not present

## 2020-05-12 DIAGNOSIS — M47814 Spondylosis without myelopathy or radiculopathy, thoracic region: Secondary | ICD-10-CM | POA: Diagnosis not present

## 2020-05-12 DIAGNOSIS — S12110K Anterior displaced Type II dens fracture, subsequent encounter for fracture with nonunion: Secondary | ICD-10-CM | POA: Diagnosis not present

## 2020-05-12 DIAGNOSIS — M47812 Spondylosis without myelopathy or radiculopathy, cervical region: Secondary | ICD-10-CM | POA: Diagnosis not present

## 2020-06-08 DIAGNOSIS — M4322 Fusion of spine, cervical region: Secondary | ICD-10-CM | POA: Diagnosis not present

## 2020-06-10 DIAGNOSIS — G309 Alzheimer's disease, unspecified: Secondary | ICD-10-CM | POA: Diagnosis not present

## 2020-06-10 DIAGNOSIS — E1142 Type 2 diabetes mellitus with diabetic polyneuropathy: Secondary | ICD-10-CM | POA: Diagnosis not present

## 2020-06-10 DIAGNOSIS — S12112S Nondisplaced Type II dens fracture, sequela: Secondary | ICD-10-CM | POA: Diagnosis not present

## 2020-06-10 DIAGNOSIS — F0280 Dementia in other diseases classified elsewhere without behavioral disturbance: Secondary | ICD-10-CM | POA: Diagnosis not present

## 2020-06-10 DIAGNOSIS — F0150 Vascular dementia without behavioral disturbance: Secondary | ICD-10-CM | POA: Diagnosis not present

## 2020-06-10 DIAGNOSIS — R413 Other amnesia: Secondary | ICD-10-CM | POA: Diagnosis not present

## 2020-06-10 DIAGNOSIS — I679 Cerebrovascular disease, unspecified: Secondary | ICD-10-CM | POA: Diagnosis not present

## 2020-06-15 DIAGNOSIS — R748 Abnormal levels of other serum enzymes: Secondary | ICD-10-CM | POA: Diagnosis not present

## 2020-06-25 DIAGNOSIS — G4733 Obstructive sleep apnea (adult) (pediatric): Secondary | ICD-10-CM | POA: Diagnosis not present

## 2020-07-01 DIAGNOSIS — M4322 Fusion of spine, cervical region: Secondary | ICD-10-CM | POA: Diagnosis not present

## 2020-07-13 DIAGNOSIS — Z9889 Other specified postprocedural states: Secondary | ICD-10-CM | POA: Diagnosis not present

## 2020-07-14 DIAGNOSIS — M5382 Other specified dorsopathies, cervical region: Secondary | ICD-10-CM | POA: Diagnosis not present

## 2020-07-14 DIAGNOSIS — M4322 Fusion of spine, cervical region: Secondary | ICD-10-CM | POA: Diagnosis not present

## 2020-07-20 ENCOUNTER — Other Ambulatory Visit: Payer: Self-pay

## 2020-07-20 ENCOUNTER — Ambulatory Visit: Payer: PPO | Admitting: Podiatry

## 2020-07-20 DIAGNOSIS — L84 Corns and callosities: Secondary | ICD-10-CM

## 2020-07-20 DIAGNOSIS — E1142 Type 2 diabetes mellitus with diabetic polyneuropathy: Secondary | ICD-10-CM | POA: Diagnosis not present

## 2020-07-20 DIAGNOSIS — B351 Tinea unguium: Secondary | ICD-10-CM | POA: Diagnosis not present

## 2020-07-20 NOTE — Progress Notes (Signed)
  Subjective:  Patient ID: Jonathan Chapman, male    DOB: May 09, 1940,  MRN: [id number]  Chief Complaint  Patient presents with   Callouses    Callus of left great toe. There is also a blood spot at the left great toe.    80 y.o. male presents with the above complaint. History confirmed with patient. Unclear how it started, noticed a few days ago.  Objective:  Physical Exam: warm, good capillary refill, nail exam onychomycosis of the toenails, no trophic changes or ulcerative lesions. DP pulses palpable, PT pulses palpable, and protective sensation absent Left Foot: HPK medial hallux with dried blood, no ulcer underneath.  Right Foot: HPK medial hallux, 1st metatarsal   No images are attached to the encounter.  Assessment:   1. DM type 2 with diabetic peripheral neuropathy ([place])   2. Onychomycosis   3. Callus of foot    Plan:  Patient was evaluated and treated and all questions answered.  Onychomycosis, Diabetes, and DPN -Patient is diabetic with a qualifying condition for at risk foot care.  Procedure: Nail Debridement Type of Debridement: manual, sharp debridement. Instrumentation: Nail nipper, rotary burr. Number of Nails: 10  Procedure: Paring of Lesion Rationale: painful hyperkeratotic lesion Type of Debridement: manual, sharp debridement. Instrumentation: 312 blade Number of Lesions: 3  Return in about 3 months (around 10/20/2020) for Nail care - DM.

## 2020-08-09 DIAGNOSIS — M4322 Fusion of spine, cervical region: Secondary | ICD-10-CM | POA: Diagnosis not present

## 2020-08-31 DIAGNOSIS — S12110K Anterior displaced Type II dens fracture, subsequent encounter for fracture with nonunion: Secondary | ICD-10-CM | POA: Diagnosis not present

## 2020-08-31 DIAGNOSIS — S12101D Unspecified nondisplaced fracture of second cervical vertebra, subsequent encounter for fracture with routine healing: Secondary | ICD-10-CM | POA: Diagnosis not present

## 2020-08-31 DIAGNOSIS — M4322 Fusion of spine, cervical region: Secondary | ICD-10-CM | POA: Diagnosis not present

## 2020-09-10 DIAGNOSIS — G309 Alzheimer's disease, unspecified: Secondary | ICD-10-CM | POA: Diagnosis not present

## 2020-09-10 DIAGNOSIS — F0280 Dementia in other diseases classified elsewhere without behavioral disturbance: Secondary | ICD-10-CM | POA: Diagnosis not present

## 2020-09-10 DIAGNOSIS — G2581 Restless legs syndrome: Secondary | ICD-10-CM | POA: Diagnosis not present

## 2020-09-10 DIAGNOSIS — F0150 Vascular dementia without behavioral disturbance: Secondary | ICD-10-CM | POA: Diagnosis not present

## 2020-09-10 DIAGNOSIS — R413 Other amnesia: Secondary | ICD-10-CM | POA: Diagnosis not present

## 2020-09-10 DIAGNOSIS — I679 Cerebrovascular disease, unspecified: Secondary | ICD-10-CM | POA: Diagnosis not present

## 2020-09-15 DIAGNOSIS — M4322 Fusion of spine, cervical region: Secondary | ICD-10-CM | POA: Diagnosis not present

## 2020-10-10 DIAGNOSIS — Z95811 Presence of heart assist device: Secondary | ICD-10-CM | POA: Diagnosis not present

## 2020-10-10 DIAGNOSIS — R55 Syncope and collapse: Secondary | ICD-10-CM | POA: Diagnosis not present

## 2020-10-20 ENCOUNTER — Ambulatory Visit: Payer: PPO | Admitting: Podiatry

## 2020-10-26 DIAGNOSIS — D649 Anemia, unspecified: Secondary | ICD-10-CM | POA: Diagnosis not present

## 2020-10-26 DIAGNOSIS — G4733 Obstructive sleep apnea (adult) (pediatric): Secondary | ICD-10-CM | POA: Diagnosis not present

## 2020-10-26 DIAGNOSIS — Z23 Encounter for immunization: Secondary | ICD-10-CM | POA: Diagnosis not present

## 2020-10-26 DIAGNOSIS — F03B0 Unspecified dementia, moderate, without behavioral disturbance, psychotic disturbance, mood disturbance, and anxiety: Secondary | ICD-10-CM | POA: Diagnosis not present

## 2020-10-26 DIAGNOSIS — R748 Abnormal levels of other serum enzymes: Secondary | ICD-10-CM | POA: Diagnosis not present

## 2020-10-26 DIAGNOSIS — E785 Hyperlipidemia, unspecified: Secondary | ICD-10-CM | POA: Diagnosis not present

## 2020-10-26 DIAGNOSIS — R3915 Urgency of urination: Secondary | ICD-10-CM | POA: Diagnosis not present

## 2020-10-26 DIAGNOSIS — I10 Essential (primary) hypertension: Secondary | ICD-10-CM | POA: Diagnosis not present

## 2020-10-26 DIAGNOSIS — K219 Gastro-esophageal reflux disease without esophagitis: Secondary | ICD-10-CM | POA: Diagnosis not present

## 2020-10-26 DIAGNOSIS — E559 Vitamin D deficiency, unspecified: Secondary | ICD-10-CM | POA: Diagnosis not present

## 2020-10-26 DIAGNOSIS — I25118 Atherosclerotic heart disease of native coronary artery with other forms of angina pectoris: Secondary | ICD-10-CM | POA: Diagnosis not present

## 2020-10-26 DIAGNOSIS — E1140 Type 2 diabetes mellitus with diabetic neuropathy, unspecified: Secondary | ICD-10-CM | POA: Diagnosis not present

## 2020-10-27 DIAGNOSIS — L218 Other seborrheic dermatitis: Secondary | ICD-10-CM | POA: Diagnosis not present

## 2020-10-27 DIAGNOSIS — L820 Inflamed seborrheic keratosis: Secondary | ICD-10-CM | POA: Diagnosis not present

## 2020-10-29 DIAGNOSIS — F03B0 Unspecified dementia, moderate, without behavioral disturbance, psychotic disturbance, mood disturbance, and anxiety: Secondary | ICD-10-CM | POA: Insufficient documentation

## 2020-11-04 DIAGNOSIS — U071 COVID-19: Secondary | ICD-10-CM | POA: Diagnosis not present

## 2020-11-04 DIAGNOSIS — R051 Acute cough: Secondary | ICD-10-CM | POA: Diagnosis not present

## 2020-11-08 ENCOUNTER — Ambulatory Visit: Payer: PPO | Admitting: Podiatry

## 2020-11-11 DIAGNOSIS — F0280 Dementia in other diseases classified elsewhere without behavioral disturbance: Secondary | ICD-10-CM | POA: Diagnosis not present

## 2020-11-11 DIAGNOSIS — G309 Alzheimer's disease, unspecified: Secondary | ICD-10-CM | POA: Diagnosis not present

## 2020-11-11 DIAGNOSIS — E1142 Type 2 diabetes mellitus with diabetic polyneuropathy: Secondary | ICD-10-CM | POA: Diagnosis not present

## 2020-11-11 DIAGNOSIS — I679 Cerebrovascular disease, unspecified: Secondary | ICD-10-CM | POA: Diagnosis not present

## 2020-11-11 DIAGNOSIS — R413 Other amnesia: Secondary | ICD-10-CM | POA: Diagnosis not present

## 2020-11-11 DIAGNOSIS — G2581 Restless legs syndrome: Secondary | ICD-10-CM | POA: Diagnosis not present

## 2020-11-11 DIAGNOSIS — F0150 Vascular dementia without behavioral disturbance: Secondary | ICD-10-CM | POA: Diagnosis not present

## 2020-11-12 ENCOUNTER — Ambulatory Visit: Payer: PPO | Admitting: Podiatry

## 2020-11-12 ENCOUNTER — Other Ambulatory Visit: Payer: Self-pay

## 2020-11-12 DIAGNOSIS — B351 Tinea unguium: Secondary | ICD-10-CM | POA: Diagnosis not present

## 2020-11-12 DIAGNOSIS — E1142 Type 2 diabetes mellitus with diabetic polyneuropathy: Secondary | ICD-10-CM

## 2020-11-12 DIAGNOSIS — L84 Corns and callosities: Secondary | ICD-10-CM | POA: Diagnosis not present

## 2020-11-15 DIAGNOSIS — G4733 Obstructive sleep apnea (adult) (pediatric): Secondary | ICD-10-CM | POA: Diagnosis not present

## 2020-11-15 DIAGNOSIS — E785 Hyperlipidemia, unspecified: Secondary | ICD-10-CM | POA: Diagnosis not present

## 2020-11-15 DIAGNOSIS — Z951 Presence of aortocoronary bypass graft: Secondary | ICD-10-CM | POA: Diagnosis not present

## 2020-11-15 DIAGNOSIS — Z9989 Dependence on other enabling machines and devices: Secondary | ICD-10-CM | POA: Diagnosis not present

## 2020-11-15 DIAGNOSIS — I10 Essential (primary) hypertension: Secondary | ICD-10-CM | POA: Diagnosis not present

## 2020-11-15 DIAGNOSIS — I25118 Atherosclerotic heart disease of native coronary artery with other forms of angina pectoris: Secondary | ICD-10-CM | POA: Diagnosis not present

## 2020-11-15 DIAGNOSIS — I252 Old myocardial infarction: Secondary | ICD-10-CM | POA: Diagnosis not present

## 2020-11-16 ENCOUNTER — Encounter: Payer: Self-pay | Admitting: Podiatry

## 2020-11-16 NOTE — Progress Notes (Signed)
  Subjective:  Patient ID: Jonathan Chapman, male    DOB: [birth year],  MRN: [id number]  Jonathan Chapman presents to clinic today for at risk foot care with history of diabetic neuropathy and callus(es) bilaterally and painful thick toenails that are difficult to trim. Painful toenails interfere with ambulation. Aggravating factors include wearing enclosed shoe gear. Pain is relieved with periodic professional debridement. Painful calluses are aggravated when weightbearing with and without shoegear. Pain is relieved with periodic professional debridement.  He is accompanied by his wife on today's visit.  Patient does not monitor blood glucose daily.  PCP is Loraine Leriche., MD , and last visit was 10/26/2020.  Allergies  Allergen Reactions   Tape Rash    Review of Systems: Negative except as noted in the HPI. Objective:   Constitutional Jonathan Chapman is a pleasant 80 y.o. Caucasian male, WD, WN in NAD. AAO x 3.   Vascular CFT immediate b/l LE. Faintly palpable DP/PT pulses b/l LE. Digital hair absent b/l. Skin temperature gradient WNL b/l. No pain with calf compression b/l. No edema noted b/l. Lower extremity skin temperature gradient within normal limits. No cyanosis or clubbing noted.  Neurologic Normal speech. Oriented to person, place, and time. Protective sensation diminished with 10g monofilament b/l.  Dermatologic Toenails 1-5 b/l elongated, discolored, dystrophic, thickened, crumbly with subungual debris and tenderness to dorsal palpation. Hyperkeratotic lesion(s) R hallux and 1st metatarsal head right foot.  No erythema, no edema, no drainage, no fluctuance. Preulcerative lesion noted L hallux. There is visible subdermal hemorrhage. There is no surrounding erythema, no edema, no drainage, no odor, no fluctuance.  Orthopedic: Normal muscle strength 5/5 to all lower extremity muscle groups bilaterally. HAV with bunion deformity noted b/l LE.   Radiographs:  None Assessment:   1. Onychomycosis   2. Callus   3. Pre-ulcerative calluses   4. DM type 2 with diabetic peripheral neuropathy ([place])    Plan:  Patient was evaluated and treated and all questions answered. Consent given for treatment as described below: -Examined patient. -Continue diabetic foot care principles: inspect feet daily, monitor glucose as recommended by PCP and/or Endocrinologist, and follow prescribed diet per PCP, Endocrinologist and/or dietician. -Toenails 1-5 b/l were debrided in length and girth with sterile nail nippers and dremel without iatrogenic bleeding.  -Callus(es) R hallux and 1st metatarsal head right foot pared utilizing sterile scalpel blade without complication or incident. Total number debrided =2. -Preulcerative lesion pared L hallux. Total number pared=1. -Patient/POA to call should there be question/concern in the interim.  Return in about 3 months (around 02/12/2021).  Marzetta Board, DPM

## 2020-11-19 DIAGNOSIS — Z01818 Encounter for other preprocedural examination: Secondary | ICD-10-CM | POA: Diagnosis not present

## 2020-11-19 DIAGNOSIS — S12100A Unspecified displaced fracture of second cervical vertebra, initial encounter for closed fracture: Secondary | ICD-10-CM | POA: Diagnosis not present

## 2020-11-19 DIAGNOSIS — S12110K Anterior displaced Type II dens fracture, subsequent encounter for fracture with nonunion: Secondary | ICD-10-CM | POA: Diagnosis not present

## 2020-11-19 DIAGNOSIS — S12101D Unspecified nondisplaced fracture of second cervical vertebra, subsequent encounter for fracture with routine healing: Secondary | ICD-10-CM | POA: Diagnosis not present

## 2020-11-19 DIAGNOSIS — M4322 Fusion of spine, cervical region: Secondary | ICD-10-CM | POA: Diagnosis not present

## 2020-11-19 DIAGNOSIS — M47812 Spondylosis without myelopathy or radiculopathy, cervical region: Secondary | ICD-10-CM | POA: Diagnosis not present

## 2020-11-22 DIAGNOSIS — D72829 Elevated white blood cell count, unspecified: Secondary | ICD-10-CM | POA: Diagnosis not present

## 2020-11-23 DIAGNOSIS — G4733 Obstructive sleep apnea (adult) (pediatric): Secondary | ICD-10-CM | POA: Diagnosis not present

## 2020-12-23 DIAGNOSIS — M4712 Other spondylosis with myelopathy, cervical region: Secondary | ICD-10-CM | POA: Diagnosis not present

## 2020-12-23 DIAGNOSIS — S12101D Unspecified nondisplaced fracture of second cervical vertebra, subsequent encounter for fracture with routine healing: Secondary | ICD-10-CM | POA: Diagnosis not present

## 2020-12-23 DIAGNOSIS — M4322 Fusion of spine, cervical region: Secondary | ICD-10-CM | POA: Diagnosis not present

## 2021-01-05 NOTE — Progress Notes (Signed)
01/06/21- 80 yoM never smoker for sleep evaluation for OSA to establish. Medical problem list includes  HTN, CAD/ CABG, Osteoarthritis, BPH, Cervical DDD, Depression, Hyperlipidemia,  CPAP [employer] 9 Download- compliance 87%, AHI 4.3/ hr Epworth score-12 Body weight today-221 lbs Covid vax-3 Phizer Flu vax-had -----Patient wears CPAP already, other provider retired. Patient has no concerns at this time.  He has been using CPAP for at least 10 years and current machine is about 23 years old.  Wife has CPAP from [place] and she would like to change him also to [place]. He had remote Sinus surgery and palatoplasty by Dr. Ernesto Rutherford before starting CPAP.  He feels he is better off with CPAP and wife says it stops his snoring. He and his wife shared a recent respiratory viral pattern infection now resolving.  Prior to Admission medications   Medication Sig Start Date End Date Taking? Authorizing Provider  acetaminophen (TYLENOL) 500 MG tablet Take by mouth.   Yes [provider]  Ascorbic Acid (VITAMIN C) 100 MG tablet Take 100 mg by mouth daily.     Yes [provider]  ascorbic acid (VITAMIN C) 1000 MG tablet Take by mouth.   Yes [provider]  aspirin 81 MG EC tablet Take by mouth.   Yes [provider]  aspirin 81 MG tablet Take 81 mg by mouth daily.     Yes [provider]  atorvastatin (LIPITOR) 40 MG tablet Take 20 mg by mouth daily.    Yes [provider]  atorvastatin (LIPITOR) 40 MG tablet Take by mouth. 08/10/16  Yes [provider]  beta carotene w/minerals (OCUVITE) tablet Take 1 tablet by mouth daily.     Yes [provider]  Blood Glucose Monitoring Suppl (GLUCOCOM BLOOD GLUCOSE MONITOR) [place] See admin instructions. 07/10/19  Yes [provider]  calcium citrate (CALCITRATE - DOSED IN MG ELEMENTAL CALCIUM) 950 MG tablet Take 1 tablet by mouth daily.     Yes [provider]  carvedilol (COREG) 6.25 MG  tablet Take 6.25 mg by mouth 2 (two) times daily with a meal.     Yes [provider]  Cholecalciferol 50 MCG (2000 UT) TABS Take by mouth.   Yes [provider]  clobetasol (TEMOVATE) 0.05 % external solution  10/27/20  Yes [provider]  co-enzyme Q-10 30 MG capsule Take by mouth.   Yes [provider]  docusate sodium (COLACE) 100 MG capsule Take 100 mg by mouth 2 (two) times daily.     Yes [provider]  donepezil (ARICEPT) 10 MG tablet Take 2 tablets by mouth at bedtime. 08/10/16  Yes [provider]  fenofibrate 160 MG tablet Take by mouth daily. 12/15/19  Yes [provider]  ferrous sulfate (SLOW FE) 160 (50 Fe) MG TBCR SR tablet Take by mouth daily.     Yes [provider]  fexofenadine (ALLEGRA) 180 MG tablet Take 180 mg by mouth daily as needed.    Yes [provider]  fluticasone (CUTIVATE) 0.05 % cream Apply topically 2 (two) times daily as needed. 10/27/20  Yes [provider]  Glucosamine-Chondroitin 250-200 MG TABS Take by mouth.   Yes [provider]  glucose blood (ACCU-CHEK AVIVA PLUS) test strip Pt is to check bs three times a day 07/10/19  Yes [provider]  HYDROcodone-acetaminophen (VICODIN) 5-500 MG per tablet Take 1 tablet by mouth every 6 (six) hours as needed.     Yes [provider]  ipratropium (ATROVENT) 0.06 % nasal spray USE 2 SPRAYS IN EACH NOSTRIL DAILY AS NEEDED 04/28/16  Yes [provider]  Javier Docker Oil (OMEGA-3) 500 MG CAPS Take 1 capsule by mouth daily.   Yes [provider]  K[employer]ger Lancets UltraThin 30G MISC Use to check bs three times a day 07/10/19  Yes [provider]  levocetirizine (XYZAL) 5 MG tablet Take by mouth.   Yes [provider]  lisinopril (PRINIVIL,ZESTRIL) 5 MG tablet Take 5 mg by mouth daily.     Yes [provider]  losartan-hydrochlorothiazide (HYZAAR) 50-12.5 MG tablet Take 1 tablet  by mouth daily. 08/31/20  Yes [provider]  meloxicam (MOBIC) 7.5 MG tablet Take by mouth.   Yes [provider]  memantine (NAMENDA) 10 MG tablet Take 10 mg by mouth 2 (two) times daily. 10/22/20  Yes [provider]  multivitamin (THERAGRAN) per tablet Take 1 tablet by mouth daily.     Yes [provider]  Omega-3 1000 MG CAPS Take by mouth.   Yes [provider]  omeprazole (PRILOSEC) 20 MG capsule Take ONE (1)  capsule by mouth once daily 09/23/15  Yes [provider]  PAXLOVID, 150/100, 10 x 150 MG & 10 x 100MG  TBPK Take by mouth. 11/04/20  Yes [provider]  predniSONE (DELTASONE) 10 MG tablet Take by mouth. 08/31/20  Yes [provider]  rOPINIRole (REQUIP) 0.5 MG tablet Take 0.5 mg by mouth at bedtime. 11/11/20  Yes [provider]  sertraline (ZOLOFT) 100 MG tablet Take 150 mg by mouth daily. 10/25/20  Yes [provider]  sertraline (ZOLOFT) 50 MG tablet Take 25 mg by mouth daily.    Yes [provider]  traMADol (ULTRAM) 50 MG tablet Take 50 mg by mouth every 6 (six) hours as needed.     Yes [provider]  Vitamin D, Ergocalciferol, (DRISDOL) 50000 UNITS CAPS Take 50,000 Units by mouth.     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 No pertinent family history. Social  History   Socioeconomic History   Marital status: Married    Spouse name: Not on file   Number of children: Not on file   Years of education: Not on file   Highest education level: Not on file  Occupational History   Not on file  Tobacco Use   Smoking status: Never   Smokeless tobacco: Never  Vaping Use   Vaping Use: Never used  Substance and Sexual Activity   Alcohol use: No   Drug use: No   Sexual activity: Not on file  Other Topics Concern   Not on file  Social History Narrative   Not on file  Social Determinants of Health   Financial Resource Strain: Not on file  Food Insecurity: Not on file  Transportation Needs: Not on file  Physical Activity: Not on file  Stress: Not on file  Social Connections: Not on file  Intimate Partner Violence: Not on file   ROS-see HPI   + = positive Constitutional:    weight loss, night sweats, fevers, chills, fatigue, lassitude. HEENT:    headaches, difficulty swallowing, tooth/dental problems, sore throat,       sneezing, itching, ear ache, nasal congestion, post nasal drip, snoring CV:    chest pain, orthopnea, PND, swelling in lower extremities, anasarca,  dizziness, palpitations Resp:   shortness of breath with exertion or at rest.                productive cough,   non-productive cough, coughing up of blood.              change in color of mucus.  wheezing.   Skin:    rash or lesions. GI:  No-   heartburn, indigestion, abdominal pain, nausea, vomiting, diarrhea,                 change in bowel habits, loss of appetite GU: dysuria, change in color of urine, no urgency or frequency.   flank pain. MS:   joint pain, stiffness, decreased range of motion, back pain. Neuro-     nothing unusual Psych:  change in mood or affect.  depression or anxiety.   memory loss.  OBJ- Physical Exam General- Alert, Oriented, Affect-appropriate, Distress- none acute Skin- rash-none, lesions- none, excoriation- none Lymphadenopathy- none Head-  atraumatic            Eyes- Gross vision intact, PERRLA, conjunctivae and secretions clear            Ears- Hearing, canals-normal            Nose- Clear, no-Septal dev, mucus, polyps, erosion, perforation             Throat- +UPPP , mucosa clear , drainage- none, tonsils- atrophic Neck- +soft collar, trachea midline, no stridor , thyroid nl, carotid no bruit Chest - symmetrical excursion , unlabored           Heart/CV- RRR , no murmur , no gallop  , no rub, nl s1 s2                           - JVD- none , edema- none, stasis changes- none, varices- none           Lung- clear to P&A, wheeze- none, cough+light , dullness-none, rub- none           Chest wall-  Abd-  Br/ Gen/ Rectal- Not done, not indicated Extrem- cyanosis- none, clubbing, none, atrophy- none, strength- nl Neuro- grossly intact to observation

## 2021-01-06 ENCOUNTER — Encounter: Payer: Self-pay | Admitting: Internal Medicine

## 2021-01-06 ENCOUNTER — Other Ambulatory Visit: Payer: Self-pay

## 2021-01-06 ENCOUNTER — Ambulatory Visit (INDEPENDENT_AMBULATORY_CARE_PROVIDER_SITE_OTHER): Payer: PPO | Admitting: Internal Medicine

## 2021-01-06 VITALS — BP 110/60 | HR 61 | Temp 98.2°F | Ht 70.5 in | Wt 221.4 lb

## 2021-01-06 DIAGNOSIS — G4733 Obstructive sleep apnea (adult) (pediatric): Secondary | ICD-10-CM

## 2021-01-06 DIAGNOSIS — I2584 Coronary atherosclerosis due to calcified coronary lesion: Secondary | ICD-10-CM | POA: Diagnosis not present

## 2021-01-06 DIAGNOSIS — I2510 Atherosclerotic heart disease of native coronary artery without angina pectoris: Secondary | ICD-10-CM | POA: Diagnosis not present

## 2021-01-06 NOTE — Assessment & Plan Note (Signed)
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  He continues cardiology follow-up.

## 2021-01-06 NOTE — Patient Instructions (Addendum)
Order- DME change from [employer] to [facility] (wife asks both she and husband same DME)- please replace old CPAP machine, change to auto 5-15, mask of choice, humidifier, supplies, AirView/ card  Please call if we can help

## 2021-01-06 NOTE — Assessment & Plan Note (Signed)
We do not have his original diagnostic sleep study which was done in [facility] at least 10 years ago.  He has been fully compliant with CPAP.  Wife asks to change DME company so that she and he are with the same company and he is about due for replacement machine. Plan-at their request change DME to [facility].  Replace old machine, changing to AutoPap 5-15

## 2021-01-11 ENCOUNTER — Telehealth: Payer: Self-pay | Admitting: Internal Medicine

## 2021-01-11 DIAGNOSIS — G4733 Obstructive sleep apnea (adult) (pediatric): Secondary | ICD-10-CM

## 2021-01-12 NOTE — Telephone Encounter (Signed)
Please order home Sleep Test for dx OSA He can call for result about 2 weeks after the test.

## 2021-01-12 NOTE — Telephone Encounter (Signed)
I have placed the order for the home sleep test for this patient. I left a brief message that I had placed the order for the home sleep test and the patient was to call back with any questions.

## 2021-01-12 NOTE — Telephone Encounter (Signed)
I called the patient and let her know that we do not have a updated sleep study and per [place].  I have called the patient and he is agreeable to sleep study to get switched over to another DME company. Please advise on ordering home sleep test.

## 2021-02-14 ENCOUNTER — Ambulatory Visit: Payer: PPO

## 2021-02-14 ENCOUNTER — Other Ambulatory Visit: Payer: Self-pay

## 2021-02-14 DIAGNOSIS — G4733 Obstructive sleep apnea (adult) (pediatric): Secondary | ICD-10-CM | POA: Diagnosis not present

## 2021-02-18 DIAGNOSIS — G4733 Obstructive sleep apnea (adult) (pediatric): Secondary | ICD-10-CM

## 2021-02-22 ENCOUNTER — Encounter: Payer: Self-pay | Admitting: Podiatry

## 2021-02-22 ENCOUNTER — Other Ambulatory Visit: Payer: Self-pay

## 2021-02-22 ENCOUNTER — Ambulatory Visit: Payer: PPO | Admitting: Podiatry

## 2021-02-22 DIAGNOSIS — M79674 Pain in right toe(s): Secondary | ICD-10-CM

## 2021-02-22 DIAGNOSIS — M21621 Bunionette of right foot: Secondary | ICD-10-CM | POA: Diagnosis not present

## 2021-02-22 DIAGNOSIS — L84 Corns and callosities: Secondary | ICD-10-CM | POA: Diagnosis not present

## 2021-02-22 DIAGNOSIS — M79675 Pain in left toe(s): Secondary | ICD-10-CM | POA: Diagnosis not present

## 2021-02-22 DIAGNOSIS — M2011 Hallux valgus (acquired), right foot: Secondary | ICD-10-CM

## 2021-02-22 DIAGNOSIS — E1142 Type 2 diabetes mellitus with diabetic polyneuropathy: Secondary | ICD-10-CM

## 2021-02-22 DIAGNOSIS — M2012 Hallux valgus (acquired), left foot: Secondary | ICD-10-CM

## 2021-02-22 DIAGNOSIS — B351 Tinea unguium: Secondary | ICD-10-CM | POA: Diagnosis not present

## 2021-02-22 DIAGNOSIS — M21622 Bunionette of left foot: Secondary | ICD-10-CM

## 2021-02-22 DIAGNOSIS — E119 Type 2 diabetes mellitus without complications: Secondary | ICD-10-CM

## 2021-02-27 NOTE — Progress Notes (Signed)
ANNUAL DIABETIC FOOT EXAM  Subjective: Jonathan Chapman presents today for for annual diabetic foot examination.  Patient relates 11 year h/o diabetes.  Patient denies any h/o foot wounds.  Patient admits symptoms of numbness in feet at night.   Patient admits symptoms of foot tingling.  Patient does not monitor blood glucose daily.  Risk factors: diabetes, diabetic neuropathy, hyperlipidemia, HTN, CAD.  Loraine Leriche., MD is patient's PCP. Last visit was October 26, 2020.  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 ([place]) 07/04/2019   Hearing loss, mixed conductive and sensorineural 10/27/2016   Benign prostatic hyperplasia 10/09/2016   Generalized osteoarthritis of multiple sites 10/09/2016   GERD without esophagitis 10/09/2016   Abdominal distention 06/22/2016   DOE (dyspnea on exertion) 06/22/2016   Foot lesion 10/18/2015   Anatomical narrow angle of both eyes 07/16/2015   Diabetic peripheral neuropathy ([place]) 02/05/2014   Dehiscence of surgical wound    Adenoma of large intestine    Benign essential HTN    CAD (coronary artery disease)    Depression    Hyperlipemia    Impaired fasting glucose    Obstructive sleep apnea    Organic impotence    Nocturia    Past Surgical History:  Procedure Laterality Date   CORONARY ARTERY BYPASS GRAFT  02/21/2010   [facility]    left knee replacement  09/26/2005    right total knee arthroplasty  2009   TONSILLECTOMY     uvuloplasty     Current Outpatient Medications on File Prior to Visit  Medication Sig Dispense Refill   acetaminophen (TYLENOL) 500 MG tablet Take by mouth.     Ascorbic Acid (VITAMIN C) 100 MG tablet Take 100 mg by mouth daily.       ascorbic acid (VITAMIN C) 1000 MG tablet Take by mouth.     aspirin 81 MG EC tablet Take by mouth.     aspirin 81 MG tablet Take 81 mg by mouth daily.       atorvastatin (LIPITOR) 40 MG tablet Take 20 mg by mouth daily.      atorvastatin (LIPITOR) 40 MG tablet Take by mouth.     beta carotene w/minerals (OCUVITE) tablet Take 1 tablet by mouth daily.       Blood Glucose Monitoring Suppl (GLUCOCOM BLOOD GLUCOSE MONITOR) DEVI See admin instructions.     calcium citrate (CALCITRATE - DOSED IN MG ELEMENTAL CALCIUM) 950 MG tablet Take 1 tablet by mouth daily.       Camphor-Menthol-Methyl Sal (SALONPAS EX) Apply topically.     carvedilol (COREG) 6.25 MG tablet Take 6.25 mg by mouth 2 (two) times daily with a meal.       Cholecalciferol 50 MCG (2000 UT) TABS Take by mouth.     clobetasol (TEMOVATE) 0.05 % external solution  co-enzyme Q-10 30 MG capsule Take by mouth.     docusate sodium (COLACE) 100 MG capsule Take 100 mg by mouth 2 (two) times daily.       donepezil (ARICEPT) 10 MG tablet Take 2 tablets by mouth at bedtime.     fenofibrate 160 MG tablet Take by mouth daily.     ferrous sulfate (SLOW FE) 160 (50 Fe) MG TBCR SR tablet Take by mouth daily.       fexofenadine (ALLEGRA) 180 MG tablet Take 180 mg by mouth daily as needed.      fluticasone (CUTIVATE) 0.05 % cream Apply topically 2 (two) times daily as needed.     Glucosamine-Chondroitin 250-200 MG TABS Take by mouth.     glucose blood (ACCU-CHEK AVIVA PLUS) test strip Pt is to check bs three times a day     HYDROcodone-acetaminophen (VICODIN) 5-500 MG per tablet Take 1 tablet by mouth every 6 (six) hours as needed.       ipratropium (ATROVENT)  0.06 % nasal spray USE 2 SPRAYS IN EACH NOSTRIL DAILY AS NEEDED     Krill Oil (OMEGA-3) 500 MG CAPS Take 1 capsule by mouth daily.     [employer]ger Lancets UltraThin 30G MISC Use to check bs three times a day     levocetirizine (XYZAL) 5 MG tablet Take by mouth.     lisinopril (PRINIVIL,ZESTRIL) 5 MG tablet Take 5 mg by mouth daily.       losartan-hydrochlorothiazide (HYZAAR) 50-12.5 MG tablet Take 1 tablet by mouth daily.     meloxicam (MOBIC) 7.5 MG tablet Take by mouth.     memantine (NAMENDA) 10 MG tablet Take 10 mg by mouth 2 (two) times daily.     multivitamin (THERAGRAN) per tablet Take 1 tablet by mouth daily.       Omega-3 1000 MG CAPS Take by mouth.     omeprazole (PRILOSEC) 20 MG capsule Take ONE (1)  capsule by mouth once daily     PAXLOVID, 150/100, 10 x 150 MG & 10 x 100MG  TBPK Take by mouth.     predniSONE (DELTASONE) 10 MG tablet Take by mouth.     rOPINIRole (REQUIP) 0.5 MG tablet Take 0.5 mg by mouth at bedtime.     sertraline (ZOLOFT) 100 MG tablet Take 150 mg by mouth daily.     sertraline (ZOLOFT) 50 MG tablet Take 25 mg by mouth daily.      traMADol (ULTRAM) 50 MG tablet Take 50 mg by mouth every 6 (six) hours as needed.       Turmeric, Curcuma Longa, (TURMERIC ROOT) POWD Take by mouth.     Vitamin D, Ergocalciferol, (DRISDOL) 50000 UNITS CAPS Take 50,000 Units by mouth.       No current facility-administered medications on file prior to visit.    Allergies  Allergen Reactions   Wound Dressing Adhesive Rash   Tape Rash   Social History   Occupational History   Not on file  Tobacco Use   Smoking status: Never   Smokeless tobacco: Never  Vaping Use   Vaping Use: Never used  Substance and Sexual Activity   Alcohol use: No   Drug use: No   Sexual activity: Not on file   History reviewed. No pertinent family history. Immunization History  Administered Date(s) Administered   Influenza, High Dose Seasonal PF 09/21/2014, 09/21/2014, 11/21/2017, 09/30/2018,  10/14/2019, 10/26/2020   Influenza,inj,Quad PF,6+ Mos 10/18/2015, 10/09/2016   Influenza,inj,quad, With Preservative 10/13/2013   Influenza-Unspecified  10/13/2013, 10/18/2015   PFIZER(Purple Top)SARS-COV-2 Vaccination 02/03/2019, 02/24/2019, 10/28/2019   Pneumococcal Conjugate-13 01/08/2014   Pneumococcal Polysaccharide-23 10/18/2015   Tdap 01/09/2006, 10/09/2016   Zoster Recombinat (Shingrix) 06/14/2017, 09/12/2017     Review of Systems: Negative except as noted in the HPI.   Objective: There were no vitals filed for this visit.  Aarian Griffie Yardley is a pleasant 81 y.o. male in NAD. AAO X 3.  Vascular Examination: Capillary refill time to digits immediate b/l. Faintly palpable DP pulses b/l LE. Faintly palpable PT pulse(s) b/l LE. Pedal hair absent. No pain with calf compression b/l. Lower extremity skin temperature gradient within normal limits. No edema noted b/l LE. No cyanosis or clubbing noted b/l LE.  Dermatological Examination: Pedal integument with normal turgor, texture and tone BLE. No open wounds b/l LE. No interdigital macerations noted b/l LE. Toenails 2-5 left, R hallux, R 2nd toe, R 4th toe, and R 5th toe well maintained with adequate length. No erythema, no edema, no drainage, no fluctuance. Anonychia noted L hallux and R 3rd toe. Nailbed(s) epithelialized.  Hyperkeratotic lesion(s) bilateral great toes and submet head 1 b/l.  No erythema, no edema, no drainage, no fluctuance.  Musculoskeletal Examination: Normal muscle strength 5/5 to all lower extremity muscle groups bilaterally. HAV with bunion deformity noted b/l LE. Tailor's bunion deformity noted b/l LE.Marland Kitchen No pain, crepitus or joint limitation noted with ROM b/l LE.  Patient ambulates independently without assistive aids.  Footwear Assessment: Does the patient wear appropriate shoes? Yes. Does the patient need inserts/orthotics? No.  Neurological Examination: Protective sensation diminished with 10g monofilament  b/l.  Assessment: 1. Pain due to onychomycosis of toenails of both feet   2. Callus   3. Hallux valgus, acquired, bilateral   4. Tailor's bunion of both feet   5. DM type 2 with diabetic peripheral neuropathy ([place])   6. Encounter for diabetic foot exam ([place])     ADA Risk Categorization: High Risk  Patient has one or more of the following: Loss of protective sensation Absent pedal pulses Severe Foot deformity History of foot ulcer  Plan: -Diabetic foot examination performed today. -Continue foot and shoe inspections daily. Monitor blood glucose per PCP/Endocrinologist's recommendations. -Mycotic toenails 2-5 left, R hallux, R 2nd toe, R 4th toe, and R 5th toe were debrided in length and girth with sterile nail nippers and dremel without iatrogenic bleeding. -Callus(es) bilateral great toes and submet head 1 b/l pared utilizing sterile scalpel blade without complication or incident. Total number debrided =4. -Patient/POA to call should there be question/concern in the interim.  Return in about 3 months (around 05/22/2021).  Marzetta Board, DPM

## 2021-06-07 ENCOUNTER — Ambulatory Visit: Payer: PPO | Admitting: Podiatry

## 2021-06-08 ENCOUNTER — Telehealth: Payer: Self-pay | Admitting: Internal Medicine

## 2021-06-08 ENCOUNTER — Ambulatory Visit: Payer: PPO | Admitting: Podiatry

## 2021-06-08 DIAGNOSIS — B351 Tinea unguium: Secondary | ICD-10-CM | POA: Diagnosis not present

## 2021-06-08 DIAGNOSIS — M79675 Pain in left toe(s): Secondary | ICD-10-CM

## 2021-06-08 DIAGNOSIS — M79674 Pain in right toe(s): Secondary | ICD-10-CM

## 2021-06-08 DIAGNOSIS — E1142 Type 2 diabetes mellitus with diabetic polyneuropathy: Secondary | ICD-10-CM

## 2021-06-08 DIAGNOSIS — L84 Corns and callosities: Secondary | ICD-10-CM | POA: Diagnosis not present

## 2021-06-08 DIAGNOSIS — G4733 Obstructive sleep apnea (adult) (pediatric): Secondary | ICD-10-CM

## 2021-06-09 NOTE — Telephone Encounter (Signed)
Dr. Annamaria Boots, please advise on pt's home sleep study results. Pt had study on 02/15/21. Thanks.

## 2021-06-09 NOTE — Telephone Encounter (Signed)
Home sleep test showed severe sleep apnea, averaging 40.4 apneas/ hour, with drops in blood oxygen. In December we had asked to switch at his request from [employer] to [facility] (same as his wife),  And to replace old CPAP: auto 5-20, mask of choice, humidifier, supplies, AirView/ card  They may need him to be seen by me  for ov f/u here.

## 2021-06-12 ENCOUNTER — Encounter: Payer: Self-pay | Admitting: Podiatry

## 2021-06-12 NOTE — Progress Notes (Signed)
  Subjective:  Patient ID: Jonathan Chapman, male    DOB: 1941/01/02,  MRN: [id number]  Jonathan Chapman presents to clinic today for at risk foot care with history of diabetic neuropathy and callus(es) b/l lower extremities and painful thick toenails that are difficult to trim. Painful toenails interfere with ambulation. Aggravating factors include wearing enclosed shoe gear. Pain is relieved with periodic professional debridement. Painful calluses are aggravated when weightbearing with and without shoegear. Pain is relieved with periodic professional debridement.  Last known HgA1c was 5.8%. Patient does not monitor blood glucose daily.  New problem(s): None.   He is accompanied by his wife on today's visit.  PCP is Loraine Leriche., MD , and last visit was April 28, 2021.  Allergies  Allergen Reactions   Wound Dressing Adhesive Rash   Tape Rash    Review of Systems: Negative except as noted in the HPI.  Objective: No changes noted in today's physical examination. There were no vitals filed for this visit.  Jonathan Chapman is a pleasant 81 y.o. male in NAD. AAO X 3.  Vascular Examination: Capillary refill time to digits immediate b/l. Faintly palpable DP pulses b/l LE. Faintly palpable PT pulse(s) b/l LE. Pedal hair absent. No pain with calf compression b/l. Lower extremity skin temperature gradient within normal limits. No edema noted b/l LE. No cyanosis or clubbing noted b/l LE.  Dermatological Examination: Pedal integument with normal turgor, texture and tone BLE. No open wounds b/l LE. No interdigital macerations noted b/l LE. Toenails 2-5 left, R hallux, R 2nd toe, R 4th toe, and R 5th toe well maintained with adequate length. No erythema, no edema, no drainage, no fluctuance. Anonychia noted L hallux and R 3rd toe. Nailbed(s) epithelialized.  Hyperkeratotic lesion(s) bilateral great toes and submet head 1 b/l.  No erythema, no edema, no drainage, no  fluctuance.  Musculoskeletal Examination: Normal muscle strength 5/5 to all lower extremity muscle groups bilaterally. HAV with bunion deformity noted b/l LE. Tailor's bunion deformity noted b/l LE.Marland Kitchen No pain, crepitus or joint limitation noted with ROM b/l LE.  Patient ambulates independently without assistive aids.  Neurological Examination: Protective sensation diminished with 10g monofilament b/l.  Assessment/Plan: 1. Pain due to onychomycosis of toenails of both feet   2. Callus   3. DM type 2 with diabetic peripheral neuropathy ([place])     -Examined patient. -No new findings. No new orders. -Toenails 1-5 b/l were debrided in length and girth with sterile nail nippers and dremel without iatrogenic bleeding.  -Callus(es) IPJ of bilateral great toes and submet head 1 b/l pared utilizing sterile scalpel blade without complication or incident. Total number debrided =4. -Patient/POA to call should there be question/concern in the interim.   Return in about 3 months (around 09/08/2021).  Marzetta Board, DPM

## 2021-06-15 NOTE — Telephone Encounter (Signed)
ATC patients number but voicemail would not pick up, will try again

## 2021-06-15 NOTE — Telephone Encounter (Signed)
Lmtcb for pt.  

## 2021-06-16 NOTE — Telephone Encounter (Signed)
ATC, no answer, LVM to return call.

## 2021-07-01 ENCOUNTER — Encounter: Payer: Self-pay | Admitting: Podiatry

## 2021-07-01 NOTE — Telephone Encounter (Signed)
Called and spoke to wife about HST results. She was not aware that we had to get the okay from her or her husband to place the order for the cpap machine. She states that she was upset because she never received the machine. I advised her that the order was being placed today. She verbalized understanding. Nothing further needed

## 2021-09-09 ENCOUNTER — Ambulatory Visit: Payer: PPO | Admitting: Podiatry

## 2021-09-13 ENCOUNTER — Encounter: Payer: Self-pay | Admitting: Podiatry

## 2021-09-13 ENCOUNTER — Ambulatory Visit (INDEPENDENT_AMBULATORY_CARE_PROVIDER_SITE_OTHER): Payer: PPO | Admitting: Podiatry

## 2021-09-13 DIAGNOSIS — E1142 Type 2 diabetes mellitus with diabetic polyneuropathy: Secondary | ICD-10-CM

## 2021-09-13 DIAGNOSIS — M2011 Hallux valgus (acquired), right foot: Secondary | ICD-10-CM

## 2021-09-13 DIAGNOSIS — M79675 Pain in left toe(s): Secondary | ICD-10-CM | POA: Diagnosis not present

## 2021-09-13 DIAGNOSIS — M79674 Pain in right toe(s): Secondary | ICD-10-CM | POA: Diagnosis not present

## 2021-09-13 DIAGNOSIS — B351 Tinea unguium: Secondary | ICD-10-CM | POA: Diagnosis not present

## 2021-09-13 DIAGNOSIS — M21622 Bunionette of left foot: Secondary | ICD-10-CM

## 2021-09-13 DIAGNOSIS — M21621 Bunionette of right foot: Secondary | ICD-10-CM

## 2021-09-13 DIAGNOSIS — M2012 Hallux valgus (acquired), left foot: Secondary | ICD-10-CM

## 2021-09-13 NOTE — Progress Notes (Signed)
This patient returns to my office for at risk foot care.  This patient requires this care by a professional since this patient will be at risk due to having type 2 diabetes.    This patient is unable to cut nails himself since the patient cannot reach his nails.These nails are painful walking and wearing shoes.  This patient presents for at risk foot care today.  General Appearance  Alert, conversant and in no acute stress.  Vascular  Dorsalis pedis and posterior tibial  pulses are palpable  bilaterally.  Capillary return is within normal limits  bilaterally. Temperature is within normal limits  bilaterally.  Neurologic  Senn-Weinstein monofilament wire test within normal limits  bilaterally. Muscle power within normal limits bilaterally.  Nails Thick disfigured discolored nails with subungual debris  from hallux to fifth toes bilaterally. No evidence of bacterial infection or drainage bilaterally.  Orthopedic  No limitations of motion  feet .  No crepitus or effusions noted.  HAV  B/L.  Tailors bunion 5th MPJ right foot.  Skin  normotropic skin with no porokeratosis noted bilaterally.  No signs of infections or ulcers noted.     Onychomycosis  Pain in right toes  Pain in left toes  Consent was obtained for treatment procedures.   Mechanical debridement of nails 1-5  bilaterally performed with a nail nipper.  Filed with dremel without incident.    Return office visit   4 months                   Told patient to return for periodic foot care and evaluation due to potential at risk complications.   Gardiner Barefoot DPM

## 2021-09-20 ENCOUNTER — Ambulatory Visit: Payer: PPO | Admitting: Podiatry

## 2021-10-12 ENCOUNTER — Ambulatory Visit: Payer: PPO | Admitting: Podiatry

## 2022-01-04 NOTE — Progress Notes (Signed)
HPI M never smoker followed for OSA, complicated by  HTN, CAD/ CABG, Osteoarthritis, BPH, Cervical DDD, Depression, Hyperlipidemia, GERD, DM2,  HST 02/15/21- Complex( central > obst) AHI 40.4/ hr, desaturation to 76%, body weight 221 lbs ===============================================================  01/06/21- 74 yoM never smoker for sleep evaluation for OSA to establish. Medical problem list includes  HTN, CAD/ CABG, Osteoarthritis, BPH, Cervical DDD, Depression, Hyperlipidemia,  CPAP [employer] 9 Download- compliance 87%, AHI 4.3/ hr Epworth score-12 Body weight today-221 lbs Covid vax-3 Phizer Flu vax-had -----Patient wears CPAP already, other provider retired. Patient has no concerns at this time.  He has been using CPAP for at least 10 years and current machine is about 45 years old.  Wife has CPAP from [place] and she would like to change him also to [place]. He had remote Sinus surgery and palatoplasty by Dr. Ernesto Rutherford before starting CPAP.  He feels he is better off with CPAP and wife says it stops his snoring. He and his wife shared a recent respiratory viral pattern infection now resolving.  01/06/22- 81 yoM never smoker followed for OSA, complicated by  HTN, CAD/ CABG, Osteoarthritis, BPH, Cervical DDD, Depression, Hyperlipidemia, GERD, DM2,  CPAP auto 5-20/ [employer]     Machine replaced with change to [employer] 07/01/21 Download- compliance 77%, AHI 5.1/ hr      Some short nights Body weight today-225 lbs Covid vax-3 Phizer Flu vax-had -----Pt states cpap machine is making noise, occurs occasionally  Here with wife.  Download reviewed.  He is describing occasional mask leak with nasal mask.  He is about to have some skin lesions removed from his face and current mask will interfere.  We discussed switch to nasal pillows which she has used in the past.  The machine itself is working well.  He is comfortable with CPAP and sleeps well.  Denies breathing problems or acute changes with his  heart.  ROS-see HPI   + = positive Constitutional:    weight loss, night sweats, fevers, chills, fatigue, lassitude. HEENT:    headaches, difficulty swallowing, tooth/dental problems, sore throat,       sneezing, itching, ear ache, nasal congestion, post nasal drip, snoring CV:    chest pain, orthopnea, PND, swelling in lower extremities, anasarca,  dizziness, palpitations Resp:   shortness of breath with exertion or at rest.                productive cough,   non-productive cough, coughing up of blood.              change in color of mucus.  wheezing.   Skin:    rash or lesions. GI:  No-   heartburn, indigestion, abdominal pain, nausea, vomiting, diarrhea,                 change in bowel habits, loss of appetite GU: dysuria, change in color of urine, no urgency or frequency.   flank pain. MS:   joint pain, stiffness, decreased range of motion, back pain. Neuro-     nothing unusual Psych:  change in mood or affect.  depression or anxiety.   memory loss.  OBJ- Physical Exam General- Alert, Oriented, Affect-appropriate, Distress- none acute Skin- rash-none,  Lymphadenopathy- none Head- atraumatic            Eyes- Gross vision intact, PERRLA, conjunctivae and secretions clear            Ears- Hearing, canals-normal  Nose- Clear, no-Septal dev, mucus, polyps, erosion, perforation             Throat- +UPPP , mucosa clear , drainage- none, tonsils- atrophic Neck- +soft collar, trachea midline, no stridor , thyroid nl, carotid no bruit Chest - symmetrical excursion , unlabored           Heart/CV- RRR , no murmur , no gallop  , no rub, nl s1 s2                           - JVD- none , edema- none, stasis changes- none, varices- none           Lung- clear to P&A, wheeze- none, cough+light , dullness-none, rub- none           Chest wall-  Abd-  Br/ Gen/ Rectal- Not done, not indicated Extrem- cyanosis- none, clubbing, none, atrophy- none, strength- nl Neuro- grossly intact to  observation

## 2022-01-06 ENCOUNTER — Encounter: Payer: Self-pay | Admitting: Internal Medicine

## 2022-01-06 ENCOUNTER — Ambulatory Visit (INDEPENDENT_AMBULATORY_CARE_PROVIDER_SITE_OTHER): Payer: PPO | Admitting: Internal Medicine

## 2022-01-06 VITALS — BP 118/62 | HR 58 | Ht 70.0 in | Wt 225.0 lb

## 2022-01-06 DIAGNOSIS — G4733 Obstructive sleep apnea (adult) (pediatric): Secondary | ICD-10-CM

## 2022-01-06 NOTE — Patient Instructions (Signed)
We can continue CPAP auto 5-20  /for mask leak and upcoming surgery- Ask [facility] to let you try nasal pillows mask

## 2022-01-06 NOTE — Assessment & Plan Note (Signed)
Wife supplements his history and responses to questions when needed.

## 2022-01-06 NOTE — Assessment & Plan Note (Signed)
Benefits from CPAP with adequate compliance and control.  Having some mask leak with current nasal mask.  Since she is pending skin cancer treatment we will switch to nasal pillows for smaller contact on the face. Plan-continue auto 5-20.  Change to nasal pillows mask

## 2022-01-13 ENCOUNTER — Ambulatory Visit (INDEPENDENT_AMBULATORY_CARE_PROVIDER_SITE_OTHER): Payer: PPO | Admitting: Podiatry

## 2022-01-13 ENCOUNTER — Encounter: Payer: Self-pay | Admitting: Podiatry

## 2022-01-13 DIAGNOSIS — E1142 Type 2 diabetes mellitus with diabetic polyneuropathy: Secondary | ICD-10-CM

## 2022-01-13 DIAGNOSIS — M21621 Bunionette of right foot: Secondary | ICD-10-CM

## 2022-01-13 DIAGNOSIS — L84 Corns and callosities: Secondary | ICD-10-CM

## 2022-01-13 DIAGNOSIS — M2012 Hallux valgus (acquired), left foot: Secondary | ICD-10-CM

## 2022-01-13 DIAGNOSIS — M2011 Hallux valgus (acquired), right foot: Secondary | ICD-10-CM

## 2022-01-13 DIAGNOSIS — M21622 Bunionette of left foot: Secondary | ICD-10-CM

## 2022-01-13 DIAGNOSIS — M79675 Pain in left toe(s): Secondary | ICD-10-CM | POA: Diagnosis not present

## 2022-01-13 DIAGNOSIS — M79674 Pain in right toe(s): Secondary | ICD-10-CM

## 2022-01-13 DIAGNOSIS — B351 Tinea unguium: Secondary | ICD-10-CM

## 2022-01-13 NOTE — Progress Notes (Signed)
This patient returns to my office for at risk foot care.  This patient requires this care by a professional since this patient will be at risk due to having type 2 diabetes.    This patient is unable to cut nails himself since the patient cannot reach his nails.These nails are painful walking and wearing shoes.  This patient presents for at risk foot care today.  General Appearance  Alert, conversant and in no acute stress.  Vascular  Dorsalis pedis and posterior tibial  pulses are palpable  bilaterally.  Capillary return is within normal limits  bilaterally. Temperature is within normal limits  bilaterally.  Neurologic  Senn-Weinstein monofilament wire test within normal limits  bilaterally. Muscle power within normal limits bilaterally.  Nails Thick disfigured discolored nails with subungual debris  from hallux to fifth toes bilaterally. No evidence of bacterial infection or drainage bilaterally.  Orthopedic  No limitations of motion  feet .  No crepitus or effusions noted.  HAV  B/L.  Tailors bunion 5th MPJ right foot.  Skin  normotropic skin with no porokeratosis noted bilaterally.  No signs of infections or ulcers noted.   Callus sub 1  B/L and pinch callus  B/L.  Onychomycosis  Pain in right toes  Pain in left toes  Consent was obtained for treatment procedures.   Mechanical debridement of nails 1-5  bilaterally performed with a nail nipper.  Filed with dremel without incident. Debride callus with dremel tool.   Return office visit   3 months                   Told patient to return for periodic foot care and evaluation due to potential at risk complications.   Gardiner Barefoot DPM

## 2022-05-15 ENCOUNTER — Ambulatory Visit: Payer: PPO | Admitting: Podiatry

## 2022-05-15 ENCOUNTER — Encounter: Payer: Self-pay | Admitting: Podiatry

## 2022-05-15 DIAGNOSIS — M79675 Pain in left toe(s): Secondary | ICD-10-CM

## 2022-05-15 DIAGNOSIS — B351 Tinea unguium: Secondary | ICD-10-CM

## 2022-05-15 DIAGNOSIS — M79674 Pain in right toe(s): Secondary | ICD-10-CM | POA: Diagnosis not present

## 2022-05-15 DIAGNOSIS — E1142 Type 2 diabetes mellitus with diabetic polyneuropathy: Secondary | ICD-10-CM | POA: Diagnosis not present

## 2022-05-15 NOTE — Progress Notes (Signed)
This patient returns to my office for at risk foot care.  This patient requires this care by a professional since this patient will be at risk due to having type 2 diabetes.    This patient is unable to cut nails himself since the patient cannot reach his nails.These nails are painful walking and wearing shoes.  This patient presents for at risk foot care today.  General Appearance  Alert, conversant and in no acute stress.  Vascular  Dorsalis pedis and posterior tibial  pulses are palpable  bilaterally.  Capillary return is within normal limits  bilaterally. Temperature is within normal limits  bilaterally.  Neurologic  Senn-Weinstein monofilament wire test within normal limits  bilaterally. Muscle power within normal limits bilaterally.  Nails Thick disfigured discolored nails with subungual debris  from hallux to fifth toes bilaterally. No evidence of bacterial infection or drainage bilaterally.  Orthopedic  No limitations of motion  feet .  No crepitus or effusions noted.  HAV  B/L.  Tailors bunion 5th MPJ right foot.  Skin  normotropic skin with no porokeratosis noted bilaterally.  No signs of infections or ulcers noted.   Callus sub 1  B/L and pinch callus  B/L asymptomatic.  Onychomycosis  Pain in right toes  Pain in left toes  Consent was obtained for treatment procedures.   Mechanical debridement of nails 1-5  bilaterally performed with a nail nipper.  Filed with dremel without incident. Debride callus with dremel tool.   Return office visit   3 months                   Told patient to return for periodic foot care and evaluation due to potential at risk complications.   Helane Gunther DPM

## 2022-08-15 ENCOUNTER — Encounter: Payer: Self-pay | Admitting: Podiatry

## 2022-08-15 ENCOUNTER — Ambulatory Visit: Payer: PPO | Admitting: Podiatry

## 2022-08-15 DIAGNOSIS — M2012 Hallux valgus (acquired), left foot: Secondary | ICD-10-CM

## 2022-08-15 DIAGNOSIS — E1142 Type 2 diabetes mellitus with diabetic polyneuropathy: Secondary | ICD-10-CM | POA: Diagnosis not present

## 2022-08-15 DIAGNOSIS — M79674 Pain in right toe(s): Secondary | ICD-10-CM

## 2022-08-15 DIAGNOSIS — B351 Tinea unguium: Secondary | ICD-10-CM | POA: Diagnosis not present

## 2022-08-15 DIAGNOSIS — M79675 Pain in left toe(s): Secondary | ICD-10-CM | POA: Diagnosis not present

## 2022-08-15 DIAGNOSIS — M2011 Hallux valgus (acquired), right foot: Secondary | ICD-10-CM | POA: Diagnosis not present

## 2022-08-15 NOTE — Progress Notes (Signed)
This patient returns to my office for at risk foot care.  This patient requires this care by a professional since this patient will be at risk due to having type 2 diabetes.    This patient is unable to cut nails himself since the patient cannot reach his nails.These nails are painful walking and wearing shoes.  This patient presents for at risk foot care today.  General Appearance  Alert, conversant and in no acute stress.  Vascular  Dorsalis pedis and posterior tibial  pulses are palpable  bilaterally.  Capillary return is within normal limits  bilaterally. Temperature is within normal limits  bilaterally.  Neurologic  Senn-Weinstein monofilament wire test within normal limits  bilaterally. Muscle power within normal limits bilaterally.  Nails Thick disfigured discolored nails with subungual debris  from hallux to fifth toes bilaterally. No evidence of bacterial infection or drainage bilaterally.  Orthopedic  No limitations of motion  feet .  No crepitus or effusions noted.  HAV  B/L.  Tailors bunion 5th MPJ right foot.  Skin  normotropic skin with no porokeratosis noted bilaterally.  No signs of infections or ulcers noted.   Callus sub 1  B/L and pinch callus  B/L asymptomatic.  Onychomycosis  Pain in right toes  Pain in left toes  Consent was obtained for treatment procedures.   Mechanical debridement of nails 1-5  bilaterally performed with a nail nipper.  Filed with dremel without incident. Debride callus with dremel tool.   Return office visit   3 months                   Told patient to return for periodic foot care and evaluation due to potential at risk complications.   Helane Gunther DPM

## 2022-11-15 ENCOUNTER — Ambulatory Visit: Payer: PPO | Admitting: Podiatry

## 2023-01-08 ENCOUNTER — Ambulatory Visit: Payer: PPO | Admitting: Internal Medicine

## 2023-02-11 NOTE — Progress Notes (Signed)
 HPI Jonathan Chapman never smoker followed for OSA, complicated by  HTN, CAD/ CABG, Osteoarthritis, BPH, Cervical DDD, Depression, Hyperlipidemia, GERD, DM2,  HST 02/15/21- Complex( central > obst) AHI 40.4/ hr, desaturation to 76%, body weight 221 lbs ===============================================================   01/06/22- 81 yoM never smoker followed for OSA, complicated by  HTN, CAD/ CABG, Osteoarthritis, BPH, Cervical DDD, Depression, Hyperlipidemia, GERD, DM2,  CPAP auto 5-20/ [facility]     Machine replaced with change to [facility] 07/01/21 Download- compliance 77%, AHI 5.1/ hr      Some short nights Body weight today-225 lbs Covid vax-3 Phizer Flu vax-had -----Pt states cpap machine is making noise, occurs occasionally  Here with wife.  Download reviewed.  He is describing occasional mask leak with nasal mask.  He is about to have some skin lesions removed from his face and current mask will interfere.  We discussed switch to nasal pillows which he has used in the past.  The machine itself is working well.  He is comfortable with CPAP and sleeps well.  Denies breathing problems or acute changes with his heart.  02/13/23- 82 yoM never smoker followed for OSA, complicated by  HTN, CAD/ CABG, Osteoarthritis, BPH, Cervical DDD, Depression, Hyperlipidemia, GERD, DM2,  CPAP auto 5-20/ [facility]     Machine replaced with change to [facility] 07/01/21 Download- compliance 73%, AHI 4.3/hr Body weight today-211 lbs -----No complaints. He and wife separate bedrooms. She isn't aware when he skips CPAP due to nasal irritation from mask. Discussed the use of AI scribe software for clinical note transcription with the patient, who gave verbal consent to proceed.  History of Present Illness   The patient, with a history of sleep apnea, cardiac issues, and diabetes, presents for a follow-up visit. He reports that his CPAP machine is working well, but he does not use it every night due to irritation in his nose. Despite feeling  well-rested in the morning, the patient experiences drowsiness around 4 PM daily. He occasionally takes naps, which can last for an hour or two. The patient's spouse notes that the patient has occasional dizziness.  The patient has lost weight since his last visit, which he attributes to the addition of Farxiga to his medication regimen for diabetes. He is scheduled to have his A1c levels checked. The patient also reports that his hip pain, which was previously a concern, has improved with the weight loss.       ROS-see HPI   + = positive Constitutional:    weight loss, night sweats, fevers, chills, fatigue, lassitude. HEENT:    headaches, difficulty swallowing, tooth/dental problems, sore throat,       sneezing, itching, ear ache, nasal congestion, post nasal drip, snoring CV:    chest pain, orthopnea, PND, swelling in lower extremities, anasarca,  dizziness, palpitations Resp:   shortness of breath with exertion or at rest.                productive cough,   non-productive cough, coughing up of blood.              change in color of mucus.  wheezing.   Skin:    rash or lesions. GI:  No-   heartburn, indigestion, abdominal pain, nausea, vomiting, diarrhea,                 change in bowel habits, loss of appetite GU: dysuria, change in color of urine, no urgency or frequency.   flank pain. MS:   joint pain, stiffness, decreased  range of motion, back pain. Neuro-     nothing unusual Psych:  change in mood or affect.  depression or anxiety.   memory loss.  OBJ- Physical Exam General- Alert, Oriented, Affect-appropriate, Distress- none acute Skin- rash-none,  Lymphadenopathy- none Head- atraumatic            Eyes- Gross vision intact, PERRLA, conjunctivae and secretions clear, + photosensitive= sunglasses today            Ears- Hearing, canals-normal            Nose- Clear, no-Septal dev, mucus, polyps, erosion, perforation             Throat- +UPPP , mucosa clear , drainage- none,  tonsils- atrophic Neck- +soft collar, trachea midline, no stridor , thyroid  nl, carotid no bruit Chest - symmetrical excursion , unlabored           Heart/CV- RRR , no murmur , no gallop  , no rub, nl s1 s2                           - JVD- none , edema- none, stasis changes- none, varices- none           Lung- clear to P&A, wheeze- none, cough-none , dullness-none, rub- none           Chest wall-  Abd-  Br/ Gen/ Rectal- Not done, not indicated Extrem- cyanosis- none, clubbing, none, atrophy- none, strength- nl Neuro- grossly intact to observation  Assessment and Plan    Obstructive Sleep Apnea Patient reports inconsistent use of CPAP due to nasal irritation. No changes in sleep pattern or quality reported. -Continue current CPAP settings. -Consider different mask styles to address nasal irritation. Patient to make an appointment with [facility] to explore options.  Afternoon Fatigue Patient reports daily afternoon fatigue despite feeling well-rested from sleep. Currently on Sertraline. -Consider over-the-counter caffeine tablet in the early afternoon to counteract fatigue.  Weight Loss and Diabetes Management Patient has lost weight since last visit, possibly due to addition of Farxiga to Metformin regimen. A1c to be checked tomorrow. -Continue current medication regimen. -Check A1c on 02/14/2023.  Hip Pain Patient reports previous hip pain, but no current complaints after weight loss and improved diabetes control. -No changes to current management.  Follow-up Await CPAP usage data from [facility]. Continue monitoring sleep and overall health status.

## 2023-02-13 ENCOUNTER — Telehealth: Payer: Self-pay | Admitting: *Deleted

## 2023-02-13 ENCOUNTER — Encounter: Payer: Self-pay | Admitting: Internal Medicine

## 2023-02-13 ENCOUNTER — Ambulatory Visit (INDEPENDENT_AMBULATORY_CARE_PROVIDER_SITE_OTHER): Payer: PPO | Admitting: Internal Medicine

## 2023-02-13 VITALS — BP 110/60 | HR 81 | Temp 97.7°F | Ht 70.0 in | Wt 211.2 lb

## 2023-02-13 DIAGNOSIS — G4733 Obstructive sleep apnea (adult) (pediatric): Secondary | ICD-10-CM

## 2023-02-13 NOTE — Patient Instructions (Signed)
We can continue CPAP auto 5-20  You can call [facility], the DME company, for an appointment to look at different mask styles if you want.  Please call if we can help

## 2023-02-13 NOTE — Telephone Encounter (Signed)
ATC Brad with [employer] regarding a DL for this patient.  Left VM.

## 2023-02-15 ENCOUNTER — Ambulatory Visit (INDEPENDENT_AMBULATORY_CARE_PROVIDER_SITE_OTHER): Payer: PPO | Admitting: Podiatry

## 2023-02-15 ENCOUNTER — Encounter: Payer: Self-pay | Admitting: Podiatry

## 2023-02-15 DIAGNOSIS — M2011 Hallux valgus (acquired), right foot: Secondary | ICD-10-CM

## 2023-02-15 DIAGNOSIS — E1142 Type 2 diabetes mellitus with diabetic polyneuropathy: Secondary | ICD-10-CM | POA: Diagnosis not present

## 2023-02-15 DIAGNOSIS — B351 Tinea unguium: Secondary | ICD-10-CM | POA: Diagnosis not present

## 2023-02-15 DIAGNOSIS — M79675 Pain in left toe(s): Secondary | ICD-10-CM

## 2023-02-15 DIAGNOSIS — L84 Corns and callosities: Secondary | ICD-10-CM

## 2023-02-15 DIAGNOSIS — M79674 Pain in right toe(s): Secondary | ICD-10-CM

## 2023-02-15 DIAGNOSIS — M2012 Hallux valgus (acquired), left foot: Secondary | ICD-10-CM

## 2023-02-15 NOTE — Progress Notes (Signed)
 This patient returns to my office for at risk foot care.  This patient requires this care by a professional since this patient will be at risk due to having type 2 diabetes.    This patient is unable to cut nails himself since the patient cannot reach his nails.These nails are painful walking and wearing shoes.  This patient presents for at risk foot care today.  General Appearance  Alert, conversant and in no acute stress.  Vascular  Dorsalis pedis and posterior tibial  pulses are palpable  bilaterally.  Capillary return is within normal limits  bilaterally. Temperature is within normal limits  bilaterally.  Neurologic  Senn-Weinstein monofilament wire test within normal limits  bilaterally. Muscle power within normal limits bilaterally.  Nails Thick disfigured discolored nails with subungual debris  from hallux to fifth toes bilaterally. No evidence of bacterial infection or drainage bilaterally.  Orthopedic  No limitations of motion  feet .  No crepitus or effusions noted.  HAV  B/L.  Tailors bunion 5th MPJ right foot.  Skin  normotropic skin with no porokeratosis noted bilaterally.  No signs of infections or ulcers noted.   Callus sub 1 right  and pinch callus  right  symptomatic.  Onychomycosis  Pain in right toes  Pain in left toes  Callus left foot.  Consent was obtained for treatment procedures.   Mechanical debridement of nails 1-5  bilaterally performed with a nail nipper.  Filed with dremel without incident. Debride callus with dremel tool.   Return office visit   3 months                   Told patient to return for periodic foot care and evaluation due to potential at risk complications.   Ruffin Cotton DPM

## 2023-05-22 ENCOUNTER — Encounter: Payer: Self-pay | Admitting: Podiatry

## 2023-05-22 ENCOUNTER — Ambulatory Visit: Admitting: Podiatry

## 2023-05-22 DIAGNOSIS — M79675 Pain in left toe(s): Secondary | ICD-10-CM

## 2023-05-22 DIAGNOSIS — B351 Tinea unguium: Secondary | ICD-10-CM | POA: Diagnosis not present

## 2023-05-22 DIAGNOSIS — M79674 Pain in right toe(s): Secondary | ICD-10-CM | POA: Diagnosis not present

## 2023-05-22 DIAGNOSIS — E1142 Type 2 diabetes mellitus with diabetic polyneuropathy: Secondary | ICD-10-CM | POA: Diagnosis not present

## 2023-05-22 DIAGNOSIS — L84 Corns and callosities: Secondary | ICD-10-CM

## 2023-05-22 NOTE — Progress Notes (Signed)
 This patient returns to my office for at risk foot care.  This patient requires this care by a professional since this patient will be at risk due to having type 2 diabetes.    This patient is unable to cut nails himself since the patient cannot reach his nails.These nails are painful walking and wearing shoes.  This patient presents for at risk foot care today.  General Appearance  Alert, conversant and in no acute stress.  Vascular  Dorsalis pedis and posterior tibial  pulses are palpable  bilaterally.  Capillary return is within normal limits  bilaterally. Temperature is within normal limits  bilaterally.  Neurologic  Senn-Weinstein monofilament wire test within normal limits  bilaterally. Muscle power within normal limits bilaterally.  Nails Thick disfigured discolored nails with subungual debris  from hallux to fifth toes bilaterally. No evidence of bacterial infection or drainage bilaterally.  Orthopedic  No limitations of motion  feet .  No crepitus or effusions noted.  HAV  B/L.  Tailors bunion 5th MPJ right foot.  Skin  normotropic skin with no porokeratosis noted bilaterally.  No signs of infections or ulcers noted.   Callus sub 1 right  and pinch callus  right  symptomatic.  Onychomycosis  Pain in right toes  Pain in left toes  Callus left foot.  Consent was obtained for treatment procedures.   Mechanical debridement of nails 1-5  bilaterally performed with a nail nipper.  Filed with dremel without incident. Debride callus with dremel tool.   Return office visit   3 months                   Told patient to return for periodic foot care and evaluation due to potential at risk complications.   Ruffin Cotton DPM

## 2023-09-24 ENCOUNTER — Ambulatory Visit (INDEPENDENT_AMBULATORY_CARE_PROVIDER_SITE_OTHER): Admitting: Podiatry

## 2023-09-24 ENCOUNTER — Encounter: Payer: Self-pay | Admitting: Podiatry

## 2023-09-24 DIAGNOSIS — M79674 Pain in right toe(s): Secondary | ICD-10-CM

## 2023-09-24 DIAGNOSIS — B351 Tinea unguium: Secondary | ICD-10-CM

## 2023-09-24 DIAGNOSIS — E1142 Type 2 diabetes mellitus with diabetic polyneuropathy: Secondary | ICD-10-CM

## 2023-09-24 DIAGNOSIS — M79675 Pain in left toe(s): Secondary | ICD-10-CM

## 2023-09-24 NOTE — Progress Notes (Signed)
 This patient returns to my office for at risk foot care.  This patient requires this care by a professional since this patient will be at risk due to having type 2 diabetes.    This patient is unable to cut nails himself since the patient cannot reach his nails.These nails are painful walking and wearing shoes.  This patient presents for at risk foot care today.  General Appearance  Alert, conversant and in no acute stress.  Vascular  Dorsalis pedis and posterior tibial  pulses are palpable  bilaterally.  Capillary return is within normal limits  bilaterally. Temperature is within normal limits  bilaterally.  Neurologic  Senn-Weinstein monofilament wire test within normal limits  bilaterally. Muscle power within normal limits bilaterally.  Nails Thick disfigured discolored nails with subungual debris  from hallux to fifth toes bilaterally. No evidence of bacterial infection or drainage bilaterally.  Orthopedic  No limitations of motion  feet .  No crepitus or effusions noted.  HAV  B/L.  Tailors bunion 5th MPJ right foot.  Skin  normotropic skin with no porokeratosis noted bilaterally.  No signs of infections or ulcers noted.   Callus sub 1 right  and pinch callus  right  symptomatic.  Onychomycosis  Pain in right toes  Pain in left toes  Callus left foot.  Consent was obtained for treatment procedures.   Mechanical debridement of nails 1-5  bilaterally performed with a nail nipper.  Filed with dremel without incident. Debride callus with dremel tool.   Return office visit   3 months                   Told patient to return for periodic foot care and evaluation due to potential at risk complications.   Ruffin Cotton DPM

## 2024-01-24 ENCOUNTER — Encounter: Payer: Self-pay | Admitting: Podiatry

## 2024-01-24 ENCOUNTER — Ambulatory Visit: Admitting: Podiatry

## 2024-01-24 DIAGNOSIS — M2012 Hallux valgus (acquired), left foot: Secondary | ICD-10-CM | POA: Diagnosis not present

## 2024-01-24 DIAGNOSIS — M79675 Pain in left toe(s): Secondary | ICD-10-CM | POA: Diagnosis not present

## 2024-01-24 DIAGNOSIS — B351 Tinea unguium: Secondary | ICD-10-CM

## 2024-01-24 DIAGNOSIS — L84 Corns and callosities: Secondary | ICD-10-CM | POA: Diagnosis not present

## 2024-01-24 DIAGNOSIS — E1142 Type 2 diabetes mellitus with diabetic polyneuropathy: Secondary | ICD-10-CM | POA: Diagnosis not present

## 2024-01-24 DIAGNOSIS — M2011 Hallux valgus (acquired), right foot: Secondary | ICD-10-CM

## 2024-01-24 DIAGNOSIS — M79674 Pain in right toe(s): Secondary | ICD-10-CM | POA: Diagnosis not present

## 2024-01-24 NOTE — Progress Notes (Signed)
 This patient returns to my office for at risk foot care.  This patient requires this care by a professional since this patient will be at risk due to having type 2 diabetes.    This patient is unable to cut nails himself since the patient cannot reach his nails.These nails are painful walking and wearing shoes.  This patient presents for at risk foot care today.  General Appearance  Alert, conversant and in no acute stress.  Vascular  Dorsalis pedis and posterior tibial  pulses are palpable  bilaterally.  Capillary return is within normal limits  bilaterally. Temperature is within normal limits  bilaterally.  Neurologic  Senn-Weinstein monofilament wire test within normal limits  bilaterally. Muscle power within normal limits bilaterally.  Nails Thick disfigured discolored nails with subungual debris  from hallux to fifth toes bilaterally. No evidence of bacterial infection or drainage bilaterally.  Orthopedic  No limitations of motion  feet .  No crepitus or effusions noted.  HAV  B/L.  Tailors bunion 5th MPJ right foot.  Skin  normotropic skin with no porokeratosis noted bilaterally.  No signs of infections or ulcers noted.   Callus sub 1 right  and pinch callus  B/L  symptomatic.  Onychomycosis  Pain in right toes  Pain in left toes  Callus left foot.  Consent was obtained for treatment procedures.   Mechanical debridement of nails 1-5  bilaterally performed with a nail nipper.  Filed with dremel without incident. Debride callus with dremel tool.   Return office visit  6  months                   Told patient to return for periodic foot care and evaluation due to potential at risk complications.   Cordella Bold DPM

## 2024-07-22 ENCOUNTER — Ambulatory Visit: Admitting: Podiatry
